# Patient Record
Sex: Female | Born: 1995 | Race: White | Hispanic: No | Marital: Single | Smoking: Current every day smoker
Health system: Southern US, Community
[De-identification: ages and names within clinical notes are randomized; demographics above are authoritative.]

## PROBLEM LIST (undated history)

## (undated) DIAGNOSIS — F319 Bipolar disorder, unspecified: Secondary | ICD-10-CM

## (undated) DIAGNOSIS — G90A Postural orthostatic tachycardia syndrome (POTS): Secondary | ICD-10-CM

## (undated) DIAGNOSIS — M25511 Pain in right shoulder: Secondary | ICD-10-CM

## (undated) DIAGNOSIS — J45909 Unspecified asthma, uncomplicated: Secondary | ICD-10-CM

## (undated) DIAGNOSIS — A749 Chlamydial infection, unspecified: Principal | ICD-10-CM

## (undated) DIAGNOSIS — K219 Gastro-esophageal reflux disease without esophagitis: Secondary | ICD-10-CM

## (undated) DIAGNOSIS — E282 Polycystic ovarian syndrome: Secondary | ICD-10-CM

## (undated) DIAGNOSIS — M542 Cervicalgia: Secondary | ICD-10-CM

## (undated) DIAGNOSIS — G540 Brachial plexus disorders: Secondary | ICD-10-CM

## (undated) DIAGNOSIS — I498 Other specified cardiac arrhythmias: Secondary | ICD-10-CM

## (undated) DIAGNOSIS — K589 Irritable bowel syndrome without diarrhea: Secondary | ICD-10-CM

## (undated) DIAGNOSIS — F419 Anxiety disorder, unspecified: Secondary | ICD-10-CM

## (undated) DIAGNOSIS — M549 Dorsalgia, unspecified: Secondary | ICD-10-CM

## (undated) DIAGNOSIS — T7840XA Allergy, unspecified, initial encounter: Secondary | ICD-10-CM

## (undated) DIAGNOSIS — F32A Depression, unspecified: Secondary | ICD-10-CM

## (undated) DIAGNOSIS — I471 Supraventricular tachycardia, unspecified: Secondary | ICD-10-CM

## (undated) DIAGNOSIS — D649 Anemia, unspecified: Secondary | ICD-10-CM

## (undated) DIAGNOSIS — R002 Palpitations: Secondary | ICD-10-CM

## (undated) HISTORY — PX: BONE RESECTION, RIB: SHX900

## (undated) HISTORY — DX: Palpitations: R00.2

## (undated) HISTORY — DX: Dorsalgia, unspecified: M54.9

## (undated) HISTORY — DX: Anemia, unspecified: D64.9

## (undated) HISTORY — DX: Irritable bowel syndrome, unspecified: K58.9

## (undated) HISTORY — DX: Anxiety disorder, unspecified: F41.9

## (undated) HISTORY — PX: UPPER GASTROINTESTINAL ENDOSCOPY: SHX188

## (undated) HISTORY — DX: Pain in right shoulder: M25.511

## (undated) HISTORY — DX: Bipolar disorder, unspecified: F31.9

## (undated) HISTORY — DX: Depression, unspecified: F32.A

## (undated) HISTORY — DX: Brachial plexus disorders: G54.0

## (undated) HISTORY — PX: NO PAST SURGERIES: SHX2092

## (undated) HISTORY — PX: COLONOSCOPY: SHX174

## (undated) HISTORY — DX: Gastro-esophageal reflux disease without esophagitis: K21.9

## (undated) HISTORY — DX: Unspecified asthma, uncomplicated: J45.909

## (undated) HISTORY — DX: Cervicalgia: M54.2

## (undated) HISTORY — DX: Allergy, unspecified, initial encounter: T78.40XA

## (undated) HISTORY — DX: Other specified cardiac arrhythmias: I49.8

## (undated) HISTORY — DX: Polycystic ovarian syndrome: E28.2

## (undated) HISTORY — DX: Chlamydial infection, unspecified: A74.9

## (undated) HISTORY — PX: SHOULDER SURGERY: SHX246

## (undated) HISTORY — DX: Postural orthostatic tachycardia syndrome (POTS): G90.A

---
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
## 2016-05-29 ENCOUNTER — Emergency Department (HOSPITAL_COMMUNITY)
Admission: EM | Admit: 2016-05-29 | Discharge: 2016-05-29 | Disposition: A | Discharge status: Home / Self Care | Payer: Worker's Compensation | Attending: Emergency Medicine | Admitting: Emergency Medicine

## 2016-05-29 ENCOUNTER — Encounter (HOSPITAL_COMMUNITY): Payer: Self-pay | Admitting: *Deleted

## 2016-05-29 ENCOUNTER — Emergency Department (HOSPITAL_COMMUNITY): Payer: Worker's Compensation

## 2016-05-29 DIAGNOSIS — X58XXXA Exposure to other specified factors, initial encounter: Secondary | ICD-10-CM | POA: Diagnosis not present

## 2016-05-29 DIAGNOSIS — S4992XA Unspecified injury of left shoulder and upper arm, initial encounter: Secondary | ICD-10-CM | POA: Diagnosis present

## 2016-05-29 DIAGNOSIS — Y999 Unspecified external cause status: Secondary | ICD-10-CM | POA: Insufficient documentation

## 2016-05-29 DIAGNOSIS — Y929 Unspecified place or not applicable: Secondary | ICD-10-CM | POA: Diagnosis not present

## 2016-05-29 DIAGNOSIS — S40021A Contusion of right upper arm, initial encounter: Secondary | ICD-10-CM | POA: Diagnosis not present

## 2016-05-29 DIAGNOSIS — F17210 Nicotine dependence, cigarettes, uncomplicated: Secondary | ICD-10-CM | POA: Insufficient documentation

## 2016-05-29 DIAGNOSIS — S46912A Strain of unspecified muscle, fascia and tendon at shoulder and upper arm level, left arm, initial encounter: Secondary | ICD-10-CM | POA: Diagnosis not present

## 2016-05-29 DIAGNOSIS — Y9389 Activity, other specified: Secondary | ICD-10-CM | POA: Insufficient documentation

## 2016-05-29 MED ORDER — HYDROCODONE-ACETAMINOPHEN 5-325 MG PO TABS: 1.0000 | ORAL_TABLET | ORAL | 0 refills | Status: DC | PRN

## 2016-05-29 MED ORDER — METHOCARBAMOL 500 MG PO TABS: 500.0000 mg | ORAL_TABLET | Freq: Three times a day (TID) | ORAL | 0 refills | Status: DC

## 2016-05-29 MED ORDER — HYDROCODONE-ACETAMINOPHEN 5-325 MG PO TABS
2.0000 | ORAL_TABLET | Freq: Once | ORAL | Status: AC
Start: 2016-05-29 — End: 2016-05-29
  Administered 2016-05-29: 2 via ORAL
  Filled 2016-05-29: qty 2

## 2016-05-29 MED ORDER — DICLOFENAC SODIUM 75 MG PO TBEC: 75.0000 mg | DELAYED_RELEASE_TABLET | Freq: Two times a day (BID) | ORAL | 0 refills | Status: DC

## 2016-05-29 MED ORDER — METHOCARBAMOL 500 MG PO TABS
1000.0000 mg | ORAL_TABLET | Freq: Once | ORAL | Status: AC
  Administered 2016-05-29: 1000 mg via ORAL
  Filled 2016-05-29: qty 2

## 2016-05-29 NOTE — ED Notes (Signed)
Pt alert & oriented x4, stable gait. Patient given discharge instructions, paperwork & prescription(s). Patient informed not to drive, operate any equipment & handel any important documents 4 hours after taking pain medication. Patient  instructed to stop at the registration desk to finish any additional paperwork. Patient  verbalized understanding. Pt left department w/ no further questions. 
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
## 2016-05-29 NOTE — Discharge Instructions (Signed)
The x-rays of your shoulder arm and elbow are negative for fracture or dislocation. Your examination favors a strain/sprain of your shoulder, and a bruise or contusion to your upper arm. Please use the shoulder sling until you are able to safely use the right upper extremity. Use diclofenac and Robaxin daily. Use Norco for more severe pain. Robaxin and Norco may cause drowsiness, please use these medications with caution. Please see Dr. Romeo AppleHarrison for orthopedic evaluation if your symptoms are worsening.

## 2016-06-02 ENCOUNTER — Telehealth: Payer: Self-pay | Admitting: Orthopaedic Surgery
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
## 2016-06-04 NOTE — Telephone Encounter (Signed)
Call received, per patient's employer contact with Jeanie CooksAngie S, providing the following contact information:  [employer], Attention Ellin MayhewMike Gray, [address], [place], [place] [address], ph# [phone], although No Fax #, No Claim # as of yet.  Called the phone # received, and placed on hold almost 12 minutes, and no option to speak with a person.  Called back to patient, reached her voice mail, relayed we are still awaiting information so that we may fax our workers comp set-up form, and request CLAIM#.

## 2016-06-08 ENCOUNTER — Ambulatory Visit (INDEPENDENT_AMBULATORY_CARE_PROVIDER_SITE_OTHER): Payer: Worker's Compensation | Admitting: Orthopedic Surgery

## 2016-06-08 ENCOUNTER — Encounter: Payer: Self-pay | Admitting: Orthopedic Surgery

## 2016-06-08 DIAGNOSIS — S143XXA Injury of brachial plexus, initial encounter: Secondary | ICD-10-CM

## 2016-06-08 MED ORDER — HYDROCODONE-ACETAMINOPHEN 5-325 MG PO TABS: 1.0000 | ORAL_TABLET | Freq: Four times a day (QID) | ORAL | 0 refills | Status: DC | PRN

## 2016-06-08 MED ORDER — DICLOFENAC SODIUM 75 MG PO TBEC: 75.0000 mg | DELAYED_RELEASE_TABLET | Freq: Two times a day (BID) | ORAL | 2 refills | Status: DC

## 2016-06-08 MED ORDER — METHOCARBAMOL 500 MG PO TABS: 500.0000 mg | ORAL_TABLET | Freq: Three times a day (TID) | ORAL | 2 refills | Status: DC

## 2016-06-08 NOTE — Patient Instructions (Signed)
OOW 10-14 TO 4-14   CONTINUE THE MEDICATIONS AS PRESCRIBED   WE WILL CONTACT THE INSURER TO GET APPTS  WITH NEUROLOGY  PHYSICAL AND OCCUPATIONAL THERAPY     YOU WILL NEED A NERVE STUDY FIRST OF DEC
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
## 2016-06-10 NOTE — Addendum Note (Signed)
Addended by: Adella HareBOOTHE, JAIME B on: 06/10/2016 03:31 PM   Modules accepted: Orders

## 2016-07-05 ENCOUNTER — Other Ambulatory Visit: Payer: Self-pay | Admitting: *Deleted

## 2016-07-05 ENCOUNTER — Telehealth: Payer: Self-pay | Admitting: Orthopedic Surgery

## 2016-07-05 DIAGNOSIS — S143XXA Injury of brachial plexus, initial encounter: Secondary | ICD-10-CM

## 2016-07-05 NOTE — Telephone Encounter (Signed)
DONE
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
## 2016-07-15 NOTE — Telephone Encounter (Signed)
Talked with pt and her WC is working on getting her a appt with neurology.

## 2016-07-22 ENCOUNTER — Encounter: Payer: Self-pay | Admitting: Advanced Practice Midwife

## 2016-07-22 ENCOUNTER — Ambulatory Visit (INDEPENDENT_AMBULATORY_CARE_PROVIDER_SITE_OTHER): Payer: BLUE CROSS/BLUE SHIELD | Admitting: Advanced Practice Midwife

## 2016-07-22 VITALS — BP 110/80 | HR 96 | Wt 158.0 lb

## 2016-07-22 DIAGNOSIS — N760 Acute vaginitis: Secondary | ICD-10-CM | POA: Diagnosis not present

## 2016-07-22 DIAGNOSIS — B9689 Other specified bacterial agents as the cause of diseases classified elsewhere: Secondary | ICD-10-CM | POA: Diagnosis not present

## 2016-07-22 DIAGNOSIS — Z30014 Encounter for initial prescription of intrauterine contraceptive device: Secondary | ICD-10-CM

## 2016-07-22 DIAGNOSIS — N898 Other specified noninflammatory disorders of vagina: Secondary | ICD-10-CM | POA: Diagnosis not present

## 2016-07-22 DIAGNOSIS — Z3009 Encounter for other general counseling and advice on contraception: Secondary | ICD-10-CM

## 2016-07-22 MED ORDER — METRONIDAZOLE 500 MG PO TABS
500.0000 mg | ORAL_TABLET | Freq: Two times a day (BID) | ORAL | 0 refills | Status: DC
Start: 2016-07-22 — End: 2016-12-31

## 2016-07-22 MED ORDER — MISOPROSTOL 200 MCG PO TABS: ORAL_TABLET | ORAL | 0 refills | Status: DC

## 2016-07-22 NOTE — Progress Notes (Signed)
.    [facility] Visit  Patient name: Bethany Navarro MRN [id number]  Date of birth: 1996-01-04  CC & HPI:  Bethany Navarro is a 20 y.o. Caucasian female presenting today for C/O fishy odor off and on for a year.  Took BV meds once, otherwise felt like it has :come and gone on it's own"  Sx have been present for the past week, and "isn't going away this time".    Wants IUD.  Uses condoms "100%". [address]  [employer] [employer], leaves Fall of 2018, wants [facility].  Declines STD testing.   Pertinent History Reviewed:  Medical & Surgical Hx:   No past medical history on file. Past Surgical History:  Procedure Laterality Date  . NO PAST SURGERIES     No family history on file.  Current Outpatient Prescriptions:  .  diclofenac (VOLTAREN) 75 MG EC tablet, Take 1 tablet (75 mg total) by mouth 2 (two) times daily., Disp: 90 tablet, Rfl: 2 .  HYDROcodone-acetaminophen (NORCO/VICODIN) 5-325 MG tablet, Take 1 tablet by mouth every 6 (six) hours as needed., Disp: 90 tablet, Rfl: 0 .  methocarbamol (ROBAXIN) 500 MG tablet, Take 1 tablet (500 mg total) by mouth 3 (three) times daily., Disp: 90 tablet, Rfl: 2 .  metroNIDAZOLE (FLAGYL) 500 MG tablet, Take 1 tablet (500 mg total) by mouth 2 (two) times daily., Disp: 14 tablet, Rfl: 0 .  misoprostol (CYTOTEC) 200 MCG tablet, Take 6 hours before your appointment, Disp: 2 tablet, Rfl: 0 Social History: Reviewed -  reports that she has been smoking Cigarettes.  She has been smoking about 0.50 packs per day. She has never used smokeless tobacco.  Review of Systems:   Constitutional: Negative for fever and chills Eyes: Negative for visual disturbances Respiratory: Negative for shortness of breath, dyspnea Cardiovascular: Negative for chest pain or palpitations  Gastrointestinal: Negative for vomiting, diarrhea and constipation; no abdominal pain Genitourinary: Negative for dysuria and urgency, vaginal irritation or itching Musculoskeletal: Negative  for back pain, joint pain, myalgias  Neurological: Negative for dizziness and headaches    Objective Findings:    Physical Examination: General appearance - well appearing, and in no distress Mental status - alert, oriented to person, place, and time Chest:  Normal respiratory effort Heart - normal rate and regular rhythm Abdomen:  Soft, nontender Pelvic: SSE:  White frothy DC w/only sl amine odor.  Wet prep:  Few clue, numerous WBC, no trich or yeast.  Musculoskeletal:  Normal range of motion without pain Extremities:  No edema    No results found for this or any previous visit (from the past 24 hour(s)).    Assessment & Plan:  A:   Few Clue  Leukorrhea P:  . Orders Placed This Encounter  Procedures  . Trichomonas vaginalis, RNA  . GC/Chlamydia Probe Amp  D/T WBC  Return for IUD insertion 2nd week of January (check insutrance).  [facility] and premedicate w/ cytotec. Cycles ~ q 45 days, will put in on next period  Rx Flagyl  Probiotic q week CRESENZO-DISHMAN,Shelena Castelluccio CNM 07/22/2016 3:36 PM

## 2016-07-22 NOTE — Patient Instructions (Signed)
luvena or similar once a week

## 2016-07-26 LAB — GC/CHLAMYDIA PROBE AMP
Chlamydia trachomatis, NAA: NEGATIVE
Neisseria gonorrhoeae by PCR: NEGATIVE

## 2016-07-26 LAB — TRICHOMONAS VAGINALIS, PROBE AMP: Trich vag by NAA: NEGATIVE

## 2016-08-31 ENCOUNTER — Ambulatory Visit (INDEPENDENT_AMBULATORY_CARE_PROVIDER_SITE_OTHER): Payer: BLUE CROSS/BLUE SHIELD | Admitting: Advanced Practice Midwife

## 2016-08-31 ENCOUNTER — Encounter: Payer: Self-pay | Admitting: Advanced Practice Midwife

## 2016-08-31 VITALS — BP 138/64 | HR 86 | Ht 64.0 in | Wt 162.0 lb

## 2016-08-31 DIAGNOSIS — Z30014 Encounter for initial prescription of intrauterine contraceptive device: Secondary | ICD-10-CM | POA: Diagnosis not present

## 2016-08-31 DIAGNOSIS — Z3202 Encounter for pregnancy test, result negative: Secondary | ICD-10-CM

## 2016-08-31 DIAGNOSIS — Z30430 Encounter for insertion of intrauterine contraceptive device: Secondary | ICD-10-CM | POA: Diagnosis not present

## 2016-08-31 LAB — POCT URINE PREGNANCY: Preg Test, Ur: NEGATIVE

## 2016-08-31 NOTE — Progress Notes (Signed)
Arnetha CourserYasmine Danielle Ward is a 21 y.o. year old  female   who presents for placement of a Kyleena IUD. Her LMP was 2 days ago and her pregnancy test today is negative.    The risks and benefits of the method and placement have been thouroughly reviewed with the patient and all questions were answered.  Specifically the patient is aware of failure rate of 08/998, expulsion of the IUD and of possible perforation.  The patient is aware of irregular bleeding due to the method and understands the incidence of irregular bleeding diminishes with time.  Time out was performed.  A Graves speculum was placed.  The cervix was prepped using Betadine. The uterus was found to be neutral and it sounded to 7 cm.  The cervix was grasped with a tenaculum and the IUD was inserted to 7 cm.  It was pulled back 1 cm and the IUD was disengaged.  The strings were trimmed to 3 cm.  Sonogram was performed and the proper placement of the IUD was verified.  The patient was instructed on signs and symptoms of infection and to check for the strings after each menses or each month.  The patient is to refrain from intercourse for 3 days.  The patient is scheduled for a return appointment after her first menses or 4 weeks.  CRESENZO-DISHMAN,Ethelyn Cerniglia 08/31/2016 2:06 PM in

## 2016-09-28 ENCOUNTER — Ambulatory Visit (INDEPENDENT_AMBULATORY_CARE_PROVIDER_SITE_OTHER): Payer: BLUE CROSS/BLUE SHIELD | Admitting: Advanced Practice Midwife

## 2016-09-28 ENCOUNTER — Encounter: Payer: Self-pay | Admitting: Advanced Practice Midwife

## 2016-09-28 VITALS — BP 114/68 | HR 76 | Ht 64.0 in | Wt 160.0 lb

## 2016-09-28 DIAGNOSIS — Z30431 Encounter for routine checking of intrauterine contraceptive device: Secondary | ICD-10-CM

## 2016-09-28 NOTE — Progress Notes (Signed)
[facility] Visit  Patient name: Bethany Navarro MRN [id number]  Date of birth: 1996-03-14  CC & HPI:  Bethany Navarro is a 21 y.o.  female presenting today for IUD check. She had a Kyleen placed 1/25.  No bleeding at all  No problems.  Hasn't tried to check for strings yet.  Encouraged to do so.   Pertinent History Reviewed:  Medical & Surgical Hx:   History reviewed. No pertinent past medical history. Past Surgical History:  Procedure Laterality Date  . NO PAST SURGERIES     Family History  Problem Relation Age of Onset  . Cancer Paternal Grandfather     prostate  . Hypertension Paternal Grandfather   . Hypertension Paternal Grandmother   . Hypertension Father   . Ovarian cancer Other     Current Outpatient Prescriptions:  .  Pseudoeph-Doxylamine-DM-APAP (DAYQUIL/NYQUIL COLD/FLU RELIEF PO), Take by mouth., Disp: , Rfl:  .  diclofenac (VOLTAREN) 75 MG EC tablet, Take 1 tablet (75 mg total) by mouth 2 (two) times daily. (Patient not taking: Reported on 09/28/2016), Disp: 90 tablet, Rfl: 2 .  HYDROcodone-acetaminophen (NORCO/VICODIN) 5-325 MG tablet, Take 1 tablet by mouth every 6 (six) hours as needed. (Patient not taking: Reported on 08/31/2016), Disp: 90 tablet, Rfl: 0 .  methocarbamol (ROBAXIN) 500 MG tablet, Take 1 tablet (500 mg total) by mouth 3 (three) times daily. (Patient not taking: Reported on 08/31/2016), Disp: 90 tablet, Rfl: 2 .  metroNIDAZOLE (FLAGYL) 500 MG tablet, Take 1 tablet (500 mg total) by mouth 2 (two) times daily. (Patient not taking: Reported on 08/31/2016), Disp: 14 tablet, Rfl: 0 .  misoprostol (CYTOTEC) 200 MCG tablet, Take 6 hours before your appointment (Patient not taking: Reported on 09/28/2016), Disp: 2 tablet, Rfl: 0 Social History: Reviewed -  reports that she has been smoking Cigarettes.  She has been smoking about 0.25 packs per day. She has never used smokeless tobacco.  Review of Systems:   Constitutional: Negative for fever  and chills Eyes: Negative for visual disturbances Respiratory: Negative for shortness of breath, dyspnea Cardiovascular: Negative for chest pain or palpitations  Gastrointestinal: Negative for vomiting, diarrhea and constipation; no abdominal pain Genitourinary: Negative for dysuria and urgency, vaginal irritation or itching Musculoskeletal: Negative for back pain, joint pain, myalgias  Neurological: Negative for dizziness and headaches    Objective Findings:    Physical Examination: General appearance - well appearing, and in no distress Mental status - alert, oriented to person, place, and time Chest:  Normal respiratory effort Heart - normal rate and regular rhythm Abdomen:  Soft, nontender Pelvic: Bedside US reveals fundal IUD,  Strings palpabke Musculoskeletal:  Normal range of motion without pain Extremities:  No edema    No results found for this or any previous visit (from the past 24 hour(s)).    Assessment & Plan:  A:   IUD check P:     Return if symptoms worsen or fail to improve.  CRESENZO-DISHMAN,Keeshia Sanderlin CNM 09/28/2016 10:04 AM

## 2016-12-08 ENCOUNTER — Telehealth: Payer: Self-pay | Admitting: Obstetrics & Gynecology

## 2016-12-29 ENCOUNTER — Ambulatory Visit: Payer: BLUE CROSS/BLUE SHIELD | Admitting: Adult Health

## 2016-12-31 ENCOUNTER — Encounter: Payer: Self-pay | Admitting: Adult Health

## 2016-12-31 ENCOUNTER — Ambulatory Visit (INDEPENDENT_AMBULATORY_CARE_PROVIDER_SITE_OTHER): Payer: BLUE CROSS/BLUE SHIELD | Admitting: Adult Health

## 2016-12-31 VITALS — BP 132/70 | HR 70 | Ht 62.0 in | Wt 163.0 lb

## 2016-12-31 DIAGNOSIS — N939 Abnormal uterine and vaginal bleeding, unspecified: Secondary | ICD-10-CM | POA: Diagnosis not present

## 2016-12-31 DIAGNOSIS — Z975 Presence of (intrauterine) contraceptive device: Secondary | ICD-10-CM

## 2016-12-31 DIAGNOSIS — Z3202 Encounter for pregnancy test, result negative: Secondary | ICD-10-CM

## 2016-12-31 DIAGNOSIS — R1032 Left lower quadrant pain: Secondary | ICD-10-CM

## 2016-12-31 LAB — POCT URINE PREGNANCY: Preg Test, Ur: NEGATIVE

## 2016-12-31 MED ORDER — DOXYCYCLINE HYCLATE 100 MG PO CAPS: 100.0000 mg | ORAL_CAPSULE | Freq: Two times a day (BID) | ORAL | 0 refills | Status: DC

## 2016-12-31 NOTE — Progress Notes (Signed)
Subjective:     Patient ID: Bethany Navarro, female   DOB: 08-08-1996, 21 y.o.   MRN: [id number]  HPI Bethany GranaYasmine is a 21 year old white female, in complaining of bleeding for 23 days, has Kyleena IUD and pain in left side for 1 week.No new sex partners, no problem peeing or with BMs.  Review of Systems Pain in left side for 1 week  Bleeding for 23 days No problems peeing or with BMs.  Reviewed past medical,surgical, social and family history. Reviewed medications and allergies.     Objective:   Physical Exam BP 132/70 (BP Location: Left Arm, Patient Position: Sitting, Cuff Size: Normal)   Pulse 70   Ht 5\' 2"  (1.575 m)   Wt 163 lb (73.9 kg)   LMP 12/08/2016 Comment: on going bleeding  BMI 29.81 kg/m UPT negative, Skin warm and dry.Pelvic: external genitalia is normal in appearance no lesions, vagina: tan  discharge without odor,urethra has no lesions or masses noted, cervix:smooth, +IUD strings at os, no CMT, uterus: normal size, shape and contour, mildly tender, no masses felt, adnexa: no masses, LLQ tenderness noted. Bladder is non tender and no masses felt.  GC/CHL obtained.    Will rx doxycycline and check CBC and get US in 3 days.  Assessment:     1. LLQ pain   2. IUD (intrauterine device) in place   3. Abnormal uterine bleeding (AUB)   4. Pregnancy examination or test, negative result       Plan:     Rx doxycycline 100 mg take 1 bid x 10 days Check CBC  GC/CHL sent  Return in 3 days for GYN US and see me

## 2017-01-01 LAB — CBC
Hematocrit: 41.8 % (ref 34.0–46.6)
Hemoglobin: 14 g/dL (ref 11.1–15.9)
MCH: 27.6 pg (ref 26.6–33.0)
MCHC: 33.5 g/dL (ref 31.5–35.7)
MCV: 82 fL (ref 79–97)
Platelets: 346 10*3/uL (ref 150–379)
RBC: 5.08 x10E6/uL (ref 3.77–5.28)
RDW: 14.1 % (ref 12.3–15.4)
WBC: 11.5 10*3/uL — ABNORMAL HIGH (ref 3.4–10.8)

## 2017-01-03 ENCOUNTER — Ambulatory Visit (INDEPENDENT_AMBULATORY_CARE_PROVIDER_SITE_OTHER): Payer: BLUE CROSS/BLUE SHIELD

## 2017-01-03 ENCOUNTER — Ambulatory Visit (INDEPENDENT_AMBULATORY_CARE_PROVIDER_SITE_OTHER): Payer: BLUE CROSS/BLUE SHIELD | Admitting: Adult Health

## 2017-01-03 ENCOUNTER — Encounter: Payer: Self-pay | Admitting: Adult Health

## 2017-01-03 VITALS — BP 118/60 | HR 72 | Ht 63.0 in | Wt 160.5 lb

## 2017-01-03 DIAGNOSIS — R1032 Left lower quadrant pain: Secondary | ICD-10-CM | POA: Diagnosis not present

## 2017-01-03 DIAGNOSIS — N939 Abnormal uterine and vaginal bleeding, unspecified: Secondary | ICD-10-CM

## 2017-01-03 DIAGNOSIS — A749 Chlamydial infection, unspecified: Secondary | ICD-10-CM

## 2017-01-03 DIAGNOSIS — Z975 Presence of (intrauterine) contraceptive device: Secondary | ICD-10-CM

## 2017-01-03 HISTORY — DX: Chlamydial infection, unspecified: A74.9

## 2017-01-03 LAB — GC/CHLAMYDIA PROBE AMP
Chlamydia trachomatis, NAA: POSITIVE — AB
Neisseria gonorrhoeae by PCR: NEGATIVE

## 2017-01-03 NOTE — Progress Notes (Signed)
Pelvic us TA/TV: homogeneous anteverted uterus,wnl,normal ovaries bilat,IUD is located within the LUS,EEC 6.3 mm,no free fluid,ovaries appear mobile,left adnexal discomfort during ultrasound

## 2017-01-03 NOTE — Progress Notes (Signed)
Subjective:     Patient ID: Bethany Navarro, female   DOB: 23-Jul-1996, 21 y.o.   MRN: [id number]  HPI Bethany Navarro is a 21 year old white female in for US for LLQ pain and review labs.Feels a little better.   Review of Systems Feels a little better Reviewed past medical,surgical, social and family history. Reviewed medications and allergies.     Objective:   Physical Exam BP 118/60 (BP Location: Left Arm, Patient Position: Sitting, Cuff Size: Normal)   Pulse 72   Ht 5\' 3"  (1.6 m)   Wt 160 lb 8 oz (72.8 kg)   LMP 12/08/2016 Comment: on going bleeding  BMI 28.43 kg/m Pt informed of + chlamdyia ant that doxycycline should treat.US showed normal ovaries and uterus, IUD in LUS, and EEC 6.3 mm.    Assessment:     1. Chlamydia infection   2. IUD (intrauterine device) in place       Plan:     Finish doxycycline No sex Tell ex partner he needs to be treated Follow up in 4 weeks for POC   [facility] sent

## 2017-01-03 NOTE — Patient Instructions (Signed)
Chlamydia, Female Chlamydia is an STD (sexually transmitted disease). It is a bacterial infection that spreads (is contagious) through sexual contact. Chlamydia can occur in different areas of the body, including:  The tube that moves urine from the bladder out of the body (urethra).  The lower part of the uterus (cervix).  The throat.  The rectum.  This condition is not difficult to treat. However, if left untreated, chlamydia can lead to more serious health problems, including pelvic inflammatory disorder (PID). PID can increase your risk of not being able to have children (sterility). What are the causes? Chlamydia is caused by the bacteria Chlamydia trachomatis. It is passed from an infected partner during sexual activity. Chlamydia can spread through contact with the genitals, mouth, or rectum. What are the signs or symptoms? In some cases, there may not be any symptoms for this condition (asymptomatic), especially early in the infection. If symptoms develop, they may include:  Burning with urination.  Frequent urination.  Vaginal discharge.  Redness, soreness, and swelling (inflammation) of the rectum.  Bleeding or discharge from the rectum.  Abdominal pain.  Pain during sexual intercourse.  Bleeding between menstrual periods.  Itching, burning, or redness in the eyes, or discharge from the eyes.  How is this diagnosed? This condition may be diagnosed with:  Urine tests.  Swab tests. Depending on your symptoms, your health care provider may use a cotton swab to collect discharge from your vagina or rectum to test for the bacteria.  A pelvic exam.  How is this treated? This condition is treated with antibiotic medicines. If you are pregnant, certain types of antibiotics will need to be avoided. Follow these instructions at home: Medicines  Take over-the-counter and prescription medicines only as told by your health care provider.  Take your antibiotic medicine  as told by your health care provider. Do not stop taking the antibiotic even if you start to feel better. Sexual activity  Tell sexual partners about your infection. This includes any oral, anal, or vaginal sex partners you have had within 60 days of when your symptoms started. Sexual partners should also be treated, even if they have no signs of the disease.  Do not have sex until you and your sexual partners have completed treatment and your health care provider says it is okay. If your health care provider prescribed you a single dose treatment, wait 7 days after taking the treatment before having sex. General instructions  It is your responsibility to get your test results. Ask your health care provider, or the department performing the test, when your results will be ready.  Get plenty of rest.  Eat a healthy, well-balanced diet.  Drink enough fluids to keep your urine clear or pale yellow.  Keep all follow-up visits as told by your health care provider. This is important. You may need to be tested for infection again 3 months after treatment. How is this prevented? The only sure way to prevent chlamydia is to avoid having sex. However, you can lower your risk by:  Using latex condoms correctly every time you have sex.  Not having multiple sexual partners.  Asking if your sexual partner has been tested for STIs and had negative results.  Contact a health care provider if:  You develop new symptoms or your symptoms do not get better after completing treatment.  You have a fever or chills.  You have pain during sexual intercourse. Get help right away if:  Your pain gets worse and does   not get better with medicine.  You develop flu-like symptoms, such as night sweats, sore throat, or muscle aches.  You experience nausea or vomiting.  You have difficulty swallowing.  You have bleeding between periods or after sex.  You have irregular menstrual periods.  You have  abdominal or lower back pain that does not get better with medicine.  You feel weak or dizzy, or you faint.  You are pregnant and you develop symptoms of chlamydia. Summary  Chlamydia is an STD (sexually transmitted disease). It is a bacterial infection that spreads (is contagious) through sexual contact.  This condition is not difficult to treat, however. If left untreated, chlamydia can lead to more serious health problems, including pelvic inflammatory disease (PID).  In some cases, there may not be any symptoms for this condition (asymptomatic).  This condition is treated with antibiotic medicines.  Using latex condoms correctly every time you have sex can help prevent chlamydia. This information is not intended to replace advice given to you by your health care provider. Make sure you discuss any questions you have with your health care provider. Document Released: 05/12/2005 Document Revised: 07/19/2016 Document Reviewed: 07/19/2016 Elsevier Interactive Patient Education  2017 [employer]  

## 2017-02-07 ENCOUNTER — Ambulatory Visit: Payer: BLUE CROSS/BLUE SHIELD | Admitting: Adult Health

## 2017-11-29 ENCOUNTER — Ambulatory Visit: Payer: Self-pay | Admitting: Physical Therapy

## 2018-01-25 ENCOUNTER — Encounter: Payer: Self-pay | Admitting: Physical Therapy

## 2018-01-25 ENCOUNTER — Ambulatory Visit: Payer: Worker's Compensation | Attending: Vascular Surgery | Admitting: Physical Therapy

## 2018-01-25 DIAGNOSIS — G540 Brachial plexus disorders: Secondary | ICD-10-CM | POA: Insufficient documentation

## 2018-01-25 DIAGNOSIS — M25611 Stiffness of right shoulder, not elsewhere classified: Secondary | ICD-10-CM

## 2018-01-25 NOTE — Addendum Note (Signed)
Addended by: Guss BundeMANGAWANG, Juaquin [place] on: 01/25/2018 03:32 PM   Modules accepted: Orders

## 2018-01-25 NOTE — Therapy (Signed)
[facility] [address] [place], [place], [address] Phone: [phone]   Fax:  [phone]  Physical Therapy Evaluation  Patient Details  Name: Bethany CourserYasmine Danielle Navarro MRN: [id number] Date of Birth: [birth year] Referring Provider: Valora PiccoloJulie Ann Freischlag   Encounter Date: 01/25/2018  PT End of Session - 01/25/18 1505    Visit Number  1    Number of Visits  24    Date for PT Re-Evaluation  03/29/18    Authorization - Visit Number  1    Authorization - Number of Visits  24    PT Start Time  1300    PT Stop Time  1343    PT Time Calculation (min)  43 min    Activity Tolerance  Patient tolerated treatment well    Behavior During Therapy  [facility] for tasks assessed/performed       Past Medical History:  Diagnosis Date  . Back pain   . Chlamydia infection 01/03/2017  . Neck pain   . Shoulder pain, right    frozen shoulder    Past Surgical History:  Procedure Laterality Date  . NO PAST SURGERIES      There were no vitals filed for this visit.   Subjective Assessment - 01/25/18 1502    Subjective  Patient arrives to physical therapy with right shoulder tightness and decreased range of motion secondary to right thoracic outlet syndrome surgery on November 02, 2017. Patient reported her first rib and muscle was removed. Patient reported minimal difficulties with ADLs. Patient denies any pain with exception of a "tightness" sensation in the axilla during shoulder abduction and flexion. Patient reports numbness from pectoralis, just medial to axilla to right elbow. Patient's goals are to improve movement, improve strength and return to work and prior level of function.    Limitations  Lifting;House hold activities    Diagnostic tests  MRI, CT, Myelogram    Patient Stated Goals  Get back to normal activities and work.    Currently in Pain?  No/denies         [facility] - 01/25/18 0001      Assessment   Medical Diagnosis  Right  thoracic outlet syndrome surgery    Referring Provider  Valora PiccoloJulie Ann Freischlag    Onset Date/Surgical Date  11/02/17    Hand Dominance  Right    Next MD Visit  August 8    Prior Therapy  Yes      Precautions   Precautions  Shoulder    Precaution Comments  See Media      Restrictions   Weight Bearing Restrictions  No      Balance Screen   Has the patient fallen in the past 6 months  No    Has the patient had a decrease in activity level because of a fear of falling?   No    Is the patient reluctant to leave their home because of a fear of falling?   No      Home Public house managernvironment   Living Environment  Private residence      Prior Function   Level of Independence  Independent      Sensation   Light Touch  Impaired by gross assessment decreased sensation along medial humerus      ROM / Strength   AROM / PROM / Strength  AROM;PROM;Strength      AROM   Overall AROM   Deficits    Overall AROM Comments  WFL shoulder flexion,  ER, and IR; WNL cervical ROM    AROM Assessment Site  Shoulder    Right/Left Shoulder  Right    Right Shoulder ABduction  108 Degrees      PROM   Overall PROM Comments  R shoulder WNL in all planes; Cervical PROM WNL    PROM Assessment Site  Shoulder    Right/Left Shoulder  Right      Strength   Overall Strength Comments  R grip strength: 18 lbs of force, Left grip strength: 60 lbs of force    Strength Assessment Site  Shoulder    Right/Left Shoulder  Right    Right Shoulder Flexion  4/5    Right Shoulder ABduction  4/5    Right Shoulder Internal Rotation  4+/5    Right Shoulder External Rotation  4+/5      Palpation   Palpation comment  tenderness to palpation along pectoralis muscles and superior shoulder.                Objective measurements completed on examination: See above findings.              PT Education - 01/25/18 1504    Education Details  scapular retractions, abduction table slide,  STW/M and scar massage     Person(s) Educated  Patient    Methods  Explanation;Demonstration;Handout    Comprehension  Verbalized understanding          PT Long Term Goals - 01/25/18 1509      PT LONG TERM GOAL #1   Title  Patient will be independent with HEP    Time  8    Period  Weeks    Status  New      PT LONG TERM GOAL #2   Title  Patient will improve right grip strength to 60 lb of force or greater for improved function.    Time  6    Period  Weeks    Status  New      PT LONG TERM GOAL #3   Title  Patient will improve right shoulder strength to 5/5 for stability during functional and work activities, when medically cleared to return to work.    Time  6    Period  Weeks    Status  New      PT LONG TERM GOAL #4   Title  Patient will improve right shoulder abduction AROM to WNL to improve ability to perform ADLs and functional tasks.    Time  6    Period  Weeks    Status  New             Plan - 01/25/18 1505    Clinical Impression Statement  Patient is a 22 year old female who presents to physical therapy with reports of shoulder tightness secondary to TOS surgery, November 02, 2017. Patient demonstrates decreased shoulder AROM, WFL shoulder PROM, WFL cervical AROM and PROM. Patient noted good/ good plus strength in the right shoulder. Patient limited with right grip strength in comparison to left. Patient noted with tenderness to palpation along right pectoralis major and minor as well as superior shoulder. Patient would benefit from skilled physical therapy to address deficits and address patient's goals.    History and Personal Factors relevant to plan of care:  TOS surgery November 02, 2017    Clinical Presentation  Stable    Clinical Decision Making  Low    Rehab Potential  Excellent    PT Frequency  3x / week    PT Duration  8 weeks    PT Treatment/Interventions  Electrical Stimulation;Cryotherapy;Moist Heat;Iontophoresis 4mg /ml Dexamethasone;Neuromuscular re-education;Therapeutic  exercise;Therapeutic activities;Passive range of motion;Manual techniques;Taping;Vasopneumatic Device;ADLs/Self Care Home Management;Ultrasound    PT Next Visit Plan  Scapular retractions, chin tucks, AAROM for shoulder abduction Scar massage, STW/M to pectoralis, and axilla, modalities for pain    PT Home Exercise Plan  scapular retactions, shoulder abd table slides    Consulted and Agree with Plan of Care  Patient       Patient will benefit from skilled therapeutic intervention in order to improve the following deficits and impairments:  Impaired UE functional use, Decreased strength, Decreased range of motion, Decreased activity tolerance, Decreased endurance  Visit Diagnosis: Stiffness of right shoulder, not elsewhere classified     Problem List Patient Active Problem List   Diagnosis Date Noted  . Chlamydia infection 01/03/2017  . Abnormal uterine bleeding (AUB) 12/31/2016  . IUD (intrauterine device) in place 12/31/2016  . LLQ pain 12/31/2016  . Encounter for IUD insertion 08/31/2016   Guss Bunde, PT, DPT 01/25/2018, 3:22 PM  [facility] [address] [place], [place], [address] Phone: [phone]   Fax:  [phone]  Name: Rewa Weissberg Navarro MRN: [id number] Date of Birth: 1996-03-14

## 2018-01-27 ENCOUNTER — Ambulatory Visit: Payer: Worker's Compensation | Admitting: Physical Therapy

## 2018-01-27 ENCOUNTER — Encounter: Payer: Self-pay | Admitting: Physical Therapy

## 2018-01-27 DIAGNOSIS — G540 Brachial plexus disorders: Secondary | ICD-10-CM | POA: Diagnosis not present

## 2018-01-27 DIAGNOSIS — M25611 Stiffness of right shoulder, not elsewhere classified: Secondary | ICD-10-CM

## 2018-01-27 NOTE — Therapy (Signed)
[facility] [address] [place], [place], [address] Phone: [phone]   Fax:  [phone]  Physical Therapy Treatment  Patient Details  Name: Bethany Navarro MRN: [id number] Date of Birth: 10-29-1995 Referring Provider: Valora Piccolo Freischlag   Encounter Date: 01/27/2018  PT End of Session - 01/27/18 1031    Visit Number  2    Number of Visits  24    Date for PT Re-Evaluation  03/29/18    Authorization - Visit Number  2    Authorization - Number of Visits  24    PT Start Time  1033    PT Stop Time  1111    PT Time Calculation (min)  38 min    Activity Tolerance  Patient tolerated treatment well    Behavior During Therapy  [facility] for tasks assessed/performed       Past Medical History:  Diagnosis Date  . Back pain   . Chlamydia infection 01/03/2017  . Neck pain   . Shoulder pain, right    frozen shoulder    Past Surgical History:  Procedure Laterality Date  . NO PAST SURGERIES      There were no vitals filed for this visit.  Subjective Assessment - 01/27/18 1031    Subjective  Reports that her release date is in August. Under along R medial humerus she is still numb.    Limitations  Lifting;House hold activities    Diagnostic tests  MRI, CT, Myelogram    Patient Stated Goals  Get back to normal activities and work.    Currently in Pain?  No/denies         [facility] PT Assessment - 01/27/18 0001      Assessment   Medical Diagnosis  Right thoracic outlet syndrome surgery    Onset Date/Surgical Date  11/02/17    Hand Dominance  Right    Next MD Visit  03/23/2018    Prior Therapy  Yes      Precautions   Precautions  Shoulder    Precaution Comments  See Media      Restrictions   Weight Bearing Restrictions  No                   [facility] Adult PT Treatment/Exercise - 01/27/18 0001      Exercises   Exercises  Shoulder      Shoulder Exercises: Standing   Protraction  Strengthening;Right;20  reps;Theraband    Theraband Level (Shoulder Protraction)  Level 1 (Yellow)    External Rotation  Strengthening;Right;20 reps;Theraband    Theraband Level (Shoulder External Rotation)  Level 1 (Yellow)    Internal Rotation  Strengthening;Right;20 reps;Theraband    Theraband Level (Shoulder Internal Rotation)  Level 1 (Yellow)    Flexion  AROM;Both;15 reps BUE slides for stretch    ABduction  AAROM;Right;20 reps    Extension  Strengthening;Right;20 reps;Theraband    Theraband Level (Shoulder Extension)  Level 1 (Yellow)    Row  Strengthening;Right;20 reps;Theraband    Theraband Level (Shoulder Row)  Level 1 (Yellow)      Shoulder Exercises: ROM/Strengthening   Wall Pushups  20 reps    Pushups  15 reps pushup plus      Manual Therapy   Manual Therapy  Soft tissue mobilization    Soft tissue mobilization  STW to R bicep, axilla, lateral pectoralis to reduce muscle tightness                  PT  Long Term Goals - 01/25/18 1509      PT LONG TERM GOAL #1   Title  Patient will be independent with HEP    Time  8    Period  Weeks    Status  New      PT LONG TERM GOAL #2   Title  Patient will improve right grip strength to 60 lb of force or greater for improved function.    Time  6    Period  Weeks    Status  New      PT LONG TERM GOAL #3   Title  Patient will improve right shoulder strength to 5/5 for stability during functional and work activities, when medically cleared to return to work.    Time  6    Period  Weeks    Status  New      PT LONG TERM GOAL #4   Title  Patient will improve right shoulder abduction AROM to WNL to improve ability to perform ADLs and functional tasks.    Time  6    Period  Weeks    Status  New            Plan - 01/27/18 1126    Clinical Impression Statement  Patient tolerated today's treatment well with no real complaints of pain just that stretches "hurt but felt good." Patient guided through multiple stretches and well as  shoulder stability and strength were tolerated well. Only minimal R scapular winging noted with normal wall pushups. Patient unable to tolerate towel under R axilla for resisted ER due to discomfort and sensation. Minimal R muscle tightness noted along R pectoralis or axilla. Patient encouraged to continue HEP and progression will occur next week.    Rehab Potential  Excellent    PT Frequency  3x / week    PT Duration  8 weeks    PT Treatment/Interventions  Electrical Stimulation;Cryotherapy;Moist Heat;Iontophoresis 4mg /ml Dexamethasone;Neuromuscular re-education;Therapeutic exercise;Therapeutic activities;Passive range of motion;Manual techniques;Taping;Vasopneumatic Device;ADLs/Self Care Home Management;Ultrasound    PT Next Visit Plan  Progress with shoulder strengthening with planks on elbows as well as more postural strengthening per protocol in media.    PT Home Exercise Plan  scapular retactions, shoulder abd table slides    Consulted and Agree with Plan of Care  Patient       Patient will benefit from skilled therapeutic intervention in order to improve the following deficits and impairments:  Impaired UE functional use, Decreased strength, Decreased range of motion, Decreased activity tolerance, Decreased endurance  Visit Diagnosis: Stiffness of right shoulder, not elsewhere classified     Problem List Patient Active Problem List   Diagnosis Date Noted  . Chlamydia infection 01/03/2017  . Abnormal uterine bleeding (AUB) 12/31/2016  . IUD (intrauterine device) in place 12/31/2016  . LLQ pain 12/31/2016  . Encounter for IUD insertion 08/31/2016    Marvell FullerKelsey P Kennon, PTA 01/27/2018, 11:36 AM  [facility] [address] [place], [place], [address] Phone: [phone]   Fax:  [phone]  Name: Bethany Navarro MRN: [id number] Date of Birth: Jun 27, 1996

## 2018-01-30 ENCOUNTER — Encounter: Payer: Self-pay | Admitting: Physical Therapy

## 2018-01-30 ENCOUNTER — Ambulatory Visit: Payer: Worker's Compensation | Admitting: Physical Therapy

## 2018-01-30 DIAGNOSIS — G540 Brachial plexus disorders: Secondary | ICD-10-CM | POA: Diagnosis not present

## 2018-01-30 DIAGNOSIS — M25611 Stiffness of right shoulder, not elsewhere classified: Secondary | ICD-10-CM

## 2018-01-30 NOTE — Therapy (Signed)
[facility] [address] [place], [place], [address] Phone: [phone]   Fax:  [phone]  Physical Therapy Treatment  Patient Details  Name: Bethany Navarro MRN: [id number] Date of Birth: 1996/01/25 Referring Provider: Valora PiccoloJulie Ann Freischlag   Encounter Date: 01/30/2018  PT End of Session - 01/30/18 1123    Visit Number  3    Number of Visits  24    Date for PT Re-Evaluation  03/29/18    Authorization - Visit Number  3    Authorization - Number of Visits  24    PT Start Time  1115    PT Stop Time  1200    PT Time Calculation (min)  45 min    Activity Tolerance  Patient tolerated treatment well    Behavior During Therapy  [facility] for tasks assessed/performed       Past Medical History:  Diagnosis Date  . Back pain   . Chlamydia infection 01/03/2017  . Neck pain   . Shoulder pain, right    frozen shoulder    Past Surgical History:  Procedure Laterality Date  . NO PAST SURGERIES      There were no vitals filed for this visit.  Subjective Assessment - 01/30/18 1119    Subjective  Patient reports no pain, just tightness with ROM. Last session went well and only reports muscle fatigue and "workout" soreness.    Limitations  Lifting;House hold activities    Diagnostic tests  MRI, CT, Myelogram    Patient Stated Goals  Get back to normal activities and work.    Currently in Pain?  No/denies         [facility] - 01/30/18 0001      Assessment   Medical Diagnosis  Right thoracic outlet syndrome surgery    Onset Date/Surgical Date  11/02/17    Hand Dominance  Right    Next MD Visit  03/23/2018    Prior Therapy  Yes      Precautions   Precautions  Shoulder    Precaution Comments  See Media                   [facility]/Exercise - 01/30/18 0001      Exercises   Exercises  Shoulder      Shoulder Exercises: Standing   Protraction  Strengthening;Right;20 reps;Theraband    Theraband  Level (Shoulder Protraction)  Level 1 (Yellow)    External Rotation  Strengthening;Right;20 reps;Theraband    Theraband Level (Shoulder External Rotation)  Level 1 (Yellow)    Internal Rotation  Strengthening;Right;20 reps;Theraband    Theraband Level (Shoulder Internal Rotation)  Level 1 (Yellow)    Extension  Strengthening;Right;20 reps;Theraband    Theraband Level (Shoulder Extension)  Level 1 (Yellow)    Row  Strengthening;Right;20 reps;Theraband    Theraband Level (Shoulder Row)  Level 1 (Yellow)      Shoulder Exercises: ROM/Strengthening   UBE (Upper Arm Bike)  120 RPM 8 minutes (4 fwd, 4 bkwd)    Wall Pushups  20 reps push up plus    Plank  3 reps;Other (comment) until fatigue up to 58 seconds      Manual Therapy   Manual Therapy  Soft tissue mobilization;Passive ROM    Soft tissue mobilization  STW to R bicep, axilla, lateral pectoralis to reduce muscle tightness    Passive ROM  PROM into abduction with STW/M to decrease tightness  PT Long Term Goals - 01/25/18 1509      PT LONG TERM GOAL #1   Title  Patient will be independent with HEP    Time  8    Period  Weeks    Status  New      PT LONG TERM GOAL #2   Title  Patient will improve right grip strength to 60 lb of force or greater for improved function.    Time  6    Period  Weeks    Status  New      PT LONG TERM GOAL #3   Title  Patient will improve right shoulder strength to 5/5 for stability during functional and work activities, when medically cleared to return to work.    Time  6    Period  Weeks    Status  New      PT LONG TERM GOAL #4   Title  Patient will improve right shoulder abduction AROM to WNL to improve ability to perform ADLs and functional tasks.    Time  6    Period  Weeks    Status  New            Plan - 01/30/18 1129    Clinical Impression Statement  Patient was able to tolerate treatment well with minimal reports of discomfort. Patient noted with the most  discomfort with theraband protraction. Patient reported minimal tightness with PROM into abduction with STW/M. Patient continues to have decreased AROM abduction as well as associated tightness from elbow to pectoralis. Patient instructed to perfrom AAROM abduction with PVC at home to improve motion. Patient reported understanding and reported agreement.     Clinical Presentation  Stable    Clinical Decision Making  Low    Rehab Potential  Excellent    PT Frequency  3x / week    PT Duration  8 weeks    PT Treatment/Interventions  Electrical Stimulation;Cryotherapy;Moist Heat;Iontophoresis 4mg /ml Dexamethasone;Neuromuscular re-education;Therapeutic exercise;Therapeutic activities;Passive range of motion;Manual techniques;Taping;Vasopneumatic Device;ADLs/Self Care Home Management;Ultrasound    PT Next Visit Plan  Progress with shoulder strengthening with planks on elbows as well as more postural strengthening per protocol in media.    PT Home Exercise Plan  scapular retactions, shoulder abd table slides    Consulted and Agree with Plan of Care  Patient       Patient will benefit from skilled therapeutic intervention in order to improve the following deficits and impairments:  Impaired UE functional use, Decreased strength, Decreased range of motion, Decreased activity tolerance, Decreased endurance  Visit Diagnosis: Stiffness of right shoulder, not elsewhere classified     Problem List Patient Active Problem List   Diagnosis Date Noted  . Chlamydia infection 01/03/2017  . Abnormal uterine bleeding (AUB) 12/31/2016  . IUD (intrauterine device) in place 12/31/2016  . LLQ pain 12/31/2016  . Encounter for IUD insertion 08/31/2016   Guss Bunde, PT, DPT 01/30/2018, 12:11 PM  [facility] [address] [place], [place], [address] Phone: [phone]   Fax:  [phone]  Name: Bethany Navarro MRN: [id number] Date of Birth:  02-Dec-1995

## 2018-01-30 NOTE — Addendum Note (Signed)
Addended byGuss Bunde: Shanayah Kaffenberger on: 01/30/2018 08:24 AM   Modules accepted: Orders

## 2018-02-01 ENCOUNTER — Ambulatory Visit: Payer: Worker's Compensation | Admitting: Physical Therapy

## 2018-02-01 ENCOUNTER — Encounter: Payer: Self-pay | Admitting: Physical Therapy

## 2018-02-01 DIAGNOSIS — M25611 Stiffness of right shoulder, not elsewhere classified: Secondary | ICD-10-CM

## 2018-02-01 DIAGNOSIS — G540 Brachial plexus disorders: Secondary | ICD-10-CM | POA: Diagnosis not present

## 2018-02-01 NOTE — Therapy (Signed)
[facility] [address] [place], [place], [address] Phone: [phone]   Fax:  [phone]  Physical Therapy Treatment  Patient Details  Name: Bethany Navarro MRN: [id number] Date of Birth: [birth year] Referring Provider: Valora PiccoloJulie Ann Freischlag   Encounter Date: 02/01/2018  PT End of Session - 02/01/18 1124    Visit Number  4    Number of Visits  24    Date for PT Re-Evaluation  03/29/18    Authorization - Visit Number  4    Authorization - Number of Visits  24    PT Start Time  1119    PT Stop Time  1206    PT Time Calculation (min)  47 min    Activity Tolerance  Patient tolerated treatment well    Behavior During Therapy  [facility] for tasks assessed/performed       Past Medical History:  Diagnosis Date  . Back pain   . Chlamydia infection 01/03/2017  . Neck pain   . Shoulder pain, right    frozen shoulder    Past Surgical History:  Procedure Laterality Date  . NO PAST SURGERIES      There were no vitals filed for this visit.  Subjective Assessment - 02/01/18 1124    Subjective  Patient reported feeling sore after last visit but soreness has disspiated.    Limitations  Lifting;House hold activities    Diagnostic tests  MRI, CT, Myelogram    Patient Stated Goals  Get back to normal activities and work.    Currently in Pain?  No/denies         [facility] - 02/01/18 0001      Assessment   Medical Diagnosis  Right thoracic outlet syndrome surgery    Onset Date/Surgical Date  11/02/17    Hand Dominance  Right    Next MD Visit  03/23/2018    Prior Therapy  Yes      Precautions   Precautions  Shoulder    Precaution Comments  See Media                   [facility] Adult PT Treatment/Exercise - 02/01/18 0001      Exercises   Exercises  Shoulder      Shoulder Exercises: Standing   Protraction  Strengthening;Right;10 reps;20 reps;Theraband x30    Theraband Level (Shoulder Protraction)  Level 1  (Yellow)    External Rotation  Strengthening;Right;10 reps;20 reps;Theraband    Theraband Level (Shoulder External Rotation)  Level 1 (Yellow)    Internal Rotation  Strengthening;Right;10 reps;20 reps;Theraband x30    Theraband Level (Shoulder Internal Rotation)  Level 2 (Red)    ABduction  AAROM;Right;20 reps with PVC    Extension  Strengthening;Right;10 reps;20 reps;Theraband x30    Theraband Level (Shoulder Extension)  Level 1 (Yellow)    Row  AROM;Right;Theraband x30    Theraband Level (Shoulder Row)  Level 2 (Red)      Shoulder Exercises: ROM/Strengthening   UBE (Upper Arm Bike)  90 RPM 8 (4 fwd, 4bkwd)    Side Plank  3 reps;Other (comment) until fatigue      Manual Therapy   Manual Therapy  Soft tissue mobilization;Passive ROM    Soft tissue mobilization  STW to R bicep, axilla, lateral pectoralis to reduce muscle tightness    Passive ROM  PROM into abduction with STW/M to decrease tightness  PT Long Term Goals - 01/25/18 1509      PT LONG TERM GOAL #1   Title  Patient will be independent with HEP    Time  8    Period  Weeks    Status  New      PT LONG TERM GOAL #2   Title  Patient will improve right grip strength to 60 lb of force or greater for improved function.    Time  6    Period  Weeks    Status  New      PT LONG TERM GOAL #3   Title  Patient will improve right shoulder strength to 5/5 for stability during functional and work activities, when medically cleared to return to work.    Time  6    Period  Weeks    Status  New      PT LONG TERM GOAL #4   Title  Patient will improve right shoulder abduction AROM to WNL to improve ability to perform ADLs and functional tasks.    Time  6    Period  Weeks    Status  New            Plan - 02/01/18 1210    Clinical Impression Statement  Patient was able to tolerate treatment well with minimal reports of shoulder joint pain. Patient noted with deep pectoralis adhesions but released  after STW/M. At end of session patient's AROM abduction was reassessed and patient was able to demonstrate full range with minimal reports of "pulling."    Clinical Presentation  Stable    Clinical Decision Making  Low    Rehab Potential  Excellent    PT Frequency  3x / week    PT Duration  8 weeks    PT Treatment/Interventions  Electrical Stimulation;Cryotherapy;Moist Heat;Iontophoresis 4mg /ml Dexamethasone;Neuromuscular re-education;Therapeutic exercise;Therapeutic activities;Passive range of motion;Manual techniques;Taping;Vasopneumatic Device;ADLs/Self Care Home Management;Ultrasound    PT Next Visit Plan  Progress with shoulder strengthening with planks on elbows as well as more postural strengthening per protocol in media.    Consulted and Agree with Plan of Care  Patient       Patient will benefit from skilled therapeutic intervention in order to improve the following deficits and impairments:  Impaired UE functional use, Decreased strength, Decreased range of motion, Decreased activity tolerance, Decreased endurance  Visit Diagnosis: Stiffness of right shoulder, not elsewhere classified     Problem List Patient Active Problem List   Diagnosis Date Noted  . Chlamydia infection 01/03/2017  . Abnormal uterine bleeding (AUB) 12/31/2016  . IUD (intrauterine device) in place 12/31/2016  . LLQ pain 12/31/2016  . Encounter for IUD insertion 08/31/2016   Guss Bunde, PT, DPT 02/01/2018, 1:11 PM  [facility] [address] [place], [place], [address] Phone: [phone]   Fax:  [phone]  Name: Bethany Navarro  MRN: [id number] Date of Birth: 1996-07-31

## 2018-02-03 ENCOUNTER — Ambulatory Visit: Payer: Worker's Compensation | Admitting: Physical Therapy

## 2018-02-03 ENCOUNTER — Encounter: Payer: Self-pay | Admitting: Physical Therapy

## 2018-02-03 DIAGNOSIS — M25611 Stiffness of right shoulder, not elsewhere classified: Secondary | ICD-10-CM

## 2018-02-03 DIAGNOSIS — G540 Brachial plexus disorders: Secondary | ICD-10-CM | POA: Diagnosis not present

## 2018-02-03 NOTE — Therapy (Signed)
[facility] [address] [place], [place], [address] Phone: [phone]   Fax:  [phone]  Physical Therapy Treatment  Patient Details  Name: Bethany Navarro MRN: [id number] Date of Birth: 1996-02-15 Referring Provider: Valora PiccoloJulie Ann Freischlag   Encounter Date: 02/03/2018  PT End of Session - 02/03/18 1155    Visit Number  5    Number of Visits  24    Date for PT Re-Evaluation  03/29/18    Authorization - Visit Number  5    Authorization - Number of Visits  24    PT Start Time  1118    PT Stop Time  1155    PT Time Calculation (min)  37 min    Activity Tolerance  Patient tolerated treatment well    Behavior During Therapy  [facility] for tasks assessed/performed       Past Medical History:  Diagnosis Date  . Back pain   . Chlamydia infection 01/03/2017  . Neck pain   . Shoulder pain, right    frozen shoulder    Past Surgical History:  Procedure Laterality Date  . NO PAST SURGERIES      There were no vitals filed for this visit.  Subjective Assessment - 02/03/18 1154    Subjective  Patient reported feeling a little sore following last treatment, but reported relief later in the day.     Limitations  Lifting;House hold activities    Diagnostic tests  MRI, CT, Myelogram    Patient Stated Goals  Get back to normal activities and work.    Currently in Pain?  No/denies                       [facility] Adult PT Treatment/Exercise - 02/03/18 0001      Exercises   Exercises  Shoulder      Shoulder Exercises: Standing   Protraction  Strengthening;Right;10 reps;20 reps;Theraband x30    Theraband Level (Shoulder Protraction)  Level 1 (Yellow)    External Rotation  Strengthening;Right;10 reps;20 reps;Theraband    Theraband Level (Shoulder External Rotation)  Level 1 (Yellow)    Internal Rotation  Strengthening;Right;10 reps;20 reps;Theraband x30    Theraband Level (Shoulder Internal Rotation)  Level 2 (Red)    ABduction  AAROM;Right;20 reps with PVC    Extension  Strengthening;Right;10 reps;20 reps;Theraband x30    Theraband Level (Shoulder Extension)  Level 1 (Yellow)    Row  AROM;Right;Theraband x30    Theraband Level (Shoulder Row)  Level 2 (Red)      Shoulder Exercises: Pulleys   Flexion  3 minutes      Shoulder Exercises: ROM/Strengthening   UBE (Upper Arm Bike)  90 RPM 8 (4 fwd, 4bkwd)      Manual Therapy   Manual Therapy  Soft tissue mobilization;Passive ROM    Soft tissue mobilization  STW to R bicep, axilla, lateral pectoralis to reduce muscle tightness    Passive ROM  PROM into abduction with STW/M to decrease tightness                  PT Long Term Goals - 02/03/18 1158      PT LONG TERM GOAL #1   Title  Patient will be independent with HEP    Time  8    Period  Weeks    Status  On-going      PT LONG TERM GOAL #2   Title  Patient will improve right grip strength to  60 lb of force or greater for improved function.    Time  6    Period  Weeks    Status  New      PT LONG TERM GOAL #3   Title  Patient will improve right shoulder strength to 5/5 for stability during functional and work activities, when medically cleared to return to work.    Time  6    Period  Weeks    Status  New      PT LONG TERM GOAL #4   Title  Patient will improve right shoulder abduction AROM to WNL to improve ability to perform ADLs and functional tasks.    Time  6    Period  Weeks    Status  New            Plan - 02/03/18 1156    Clinical Impression Statement  Pt was able to tolerate all exercises well with verbal cues to slow movments and concentrate on form/techniques. Performed PROM and STW to R shoulder, pectoroalis and scar adhesions to decrease pt's stiffness/tightness. Pt reporting feeling better at end of session.     Rehab Potential  Excellent    PT Frequency  3x / week    PT Duration  8 weeks    PT Treatment/Interventions  Electrical Stimulation;Cryotherapy;Moist  Heat;Iontophoresis 4mg /ml Dexamethasone;Neuromuscular re-education;Therapeutic exercise;Therapeutic activities;Passive range of motion;Manual techniques;Taping;Vasopneumatic Device;ADLs/Self Care Home Management;Ultrasound    PT Next Visit Plan  Progress with shoulder strengthening with planks on elbows as well as more postural strengthening per protocol in media. Shoulder stability exercises    PT Home Exercise Plan  scapular retactions, shoulder abd table slides,     Consulted and Agree with Plan of Care  Patient       Patient will benefit from skilled therapeutic intervention in order to improve the following deficits and impairments:  Impaired UE functional use, Decreased strength, Decreased range of motion, Decreased activity tolerance, Decreased endurance  Visit Diagnosis: Stiffness of right shoulder, not elsewhere classified     Problem List Patient Active Problem List   Diagnosis Date Noted  . Chlamydia infection 01/03/2017  . Abnormal uterine bleeding (AUB) 12/31/2016  . IUD (intrauterine device) in place 12/31/2016  . LLQ pain 12/31/2016  . Encounter for IUD insertion 08/31/2016    Sharmon Leyden, MPT 02/03/2018, 12:09 PM  [facility] [address] [place], [place], [address] Phone: [phone]   Fax:  [phone]  Name: Bethany Navarro MRN: [id number] Date of Birth: Apr 26, 1996

## 2018-02-06 ENCOUNTER — Encounter: Payer: Self-pay | Admitting: Physical Therapy

## 2018-02-06 ENCOUNTER — Ambulatory Visit: Payer: Worker's Compensation | Admitting: Physical Therapy

## 2018-02-06 DIAGNOSIS — M25611 Stiffness of right shoulder, not elsewhere classified: Secondary | ICD-10-CM

## 2018-02-06 DIAGNOSIS — G540 Brachial plexus disorders: Secondary | ICD-10-CM | POA: Diagnosis not present

## 2018-02-06 NOTE — Therapy (Signed)
[facility] [address] [place], [place], [address] Phone: [phone]   Fax:  [phone]  Physical Therapy Treatment  Patient Details  Name: Bethany Navarro MRN: [id number] Date of Birth: 10-Aug-1996 Referring Provider: Valora PiccoloJulie Ann Freischlag   Encounter Date: 02/06/2018  PT End of Session - 02/06/18 1123    Visit Number  6    Number of Visits  24    Date for PT Re-Evaluation  03/29/18    Authorization - Visit Number  6    Authorization - Number of Visits  24    PT Start Time  1115    PT Stop Time  1201    PT Time Calculation (min)  46 min    Activity Tolerance  Patient tolerated treatment well    Behavior During Therapy  [facility] for tasks assessed/performed       Past Medical History:  Diagnosis Date  . Back pain   . Chlamydia infection 01/03/2017  . Neck pain   . Shoulder pain, right    frozen shoulder    Past Surgical History:  Procedure Laterality Date  . NO PAST SURGERIES      There were no vitals filed for this visit.  Subjective Assessment - 02/06/18 1136    Subjective  Patient reported feeling a very sore last treatment but reported feeling fine the following day.    Limitations  Lifting;House hold activities    Diagnostic tests  MRI, CT, Myelogram    Patient Stated Goals  Get back to normal activities and work.    Currently in Pain?  No/denies         [facility] - 02/06/18 0001      Assessment   Medical Diagnosis  Right thoracic outlet syndrome surgery    Onset Date/Surgical Date  11/02/17    Hand Dominance  Right    Next MD Visit  03/23/2018    Prior Therapy  Yes                   [facility] Adult PT Treatment/Exercise - 02/06/18 0001      Shoulder Exercises: Supine   Protraction  AROM;Right;10 reps;20 reps x30    ABduction  AAROM;Right;20 reps with PVC      Shoulder Exercises: Standing   Protraction  Strengthening;Right;10 reps;20 reps;Theraband x30    Theraband Level  (Shoulder Protraction)  Level 1 (Yellow)    External Rotation  Strengthening;Right;10 reps;20 reps;Theraband x30    Theraband Level (Shoulder External Rotation)  Level 1 (Yellow)    Internal Rotation  Strengthening;Right;10 reps;20 reps;Theraband x30    Theraband Level (Shoulder Internal Rotation)  Level 2 (Red)    Extension  Strengthening;Right;10 reps;20 reps;Theraband x30    Theraband Level (Shoulder Extension)  Level 1 (Yellow)    Row  AROM;Right;Theraband x30    Theraband Level (Shoulder Row)  Level 1 (Yellow)    Other Standing Exercises  scapular clocks (12, 3, 6) x10 yellow TB      Shoulder Exercises: ROM/Strengthening   UBE (Upper Arm Bike)  90 RPM 8 (4 fwd, 4bkwd)      Manual Therapy   Manual Therapy  Soft tissue mobilization;Passive ROM    Soft tissue mobilization  STW to R bicep, IASTM/STW/M axilla, lateral pectoralis to reduce muscle tightness    Passive ROM  PROM into abduction with STW/M to decrease tightness                  PT Long Term  Goals - 02/03/18 1158      PT LONG TERM GOAL #1   Title  Patient will be independent with HEP    Time  8    Period  Weeks    Status  On-going      PT LONG TERM GOAL #2   Title  Patient will improve right grip strength to 60 lb of force or greater for improved function.    Time  6    Period  Weeks    Status  New      PT LONG TERM GOAL #3   Title  Patient will improve right shoulder strength to 5/5 for stability during functional and work activities, when medically cleared to return to work.    Time  6    Period  Weeks    Status  New      PT LONG TERM GOAL #4   Title  Patient will improve right shoulder abduction AROM to WNL to improve ability to perform ADLs and functional tasks.    Time  6    Period  Weeks    Status  New            Plan - 02/06/18 1208    Clinical Impression Statement  Patient was able to tolerate treatment well with good form with all exercises. Patient noted with increased  axilla and  pectoralis tightness today and was only able to actively abduct to approximately 85 degrees. Patient reported IASTM "felt good". Good response after manual therapy technique. Patient was able to demonstrate full right shoulder active abduction after STW/M.     Clinical Presentation  Stable    Clinical Decision Making  Low    Rehab Potential  Excellent    PT Frequency  3x / week    PT Duration  8 weeks    PT Treatment/Interventions  Electrical Stimulation;Cryotherapy;Moist Heat;Iontophoresis 4mg /ml Dexamethasone;Neuromuscular re-education;Therapeutic exercise;Therapeutic activities;Passive range of motion;Manual techniques;Taping;Vasopneumatic Device;ADLs/Self Care Home Management;Ultrasound    PT Next Visit Plan  Progress with shoulder strengthening with planks on elbows as well as more postural strengthening per protocol in media. Shoulder stability exercises    Consulted and Agree with Plan of Care  Patient       Patient will benefit from skilled therapeutic intervention in order to improve the following deficits and impairments:  Impaired UE functional use, Decreased strength, Decreased range of motion, Decreased activity tolerance, Decreased endurance  Visit Diagnosis: Stiffness of right shoulder, not elsewhere classified     Problem List Patient Active Problem List   Diagnosis Date Noted  . Chlamydia infection 01/03/2017  . Abnormal uterine bleeding (AUB) 12/31/2016  . IUD (intrauterine device) in place 12/31/2016  . LLQ pain 12/31/2016  . Encounter for IUD insertion 08/31/2016   Guss Bunde, PT, DPT 02/06/2018, 12:12 PM  [facility] [address] [place], [place], [address] Phone: [phone]   Fax:  [phone]  Name: Bethany Navarro MRN: [id number] Date of Birth: 02/14/1996

## 2018-02-08 ENCOUNTER — Encounter: Payer: Self-pay | Admitting: Physical Therapy

## 2018-02-08 ENCOUNTER — Ambulatory Visit: Payer: Worker's Compensation | Admitting: Physical Therapy

## 2018-02-08 DIAGNOSIS — M25611 Stiffness of right shoulder, not elsewhere classified: Secondary | ICD-10-CM

## 2018-02-08 DIAGNOSIS — G540 Brachial plexus disorders: Secondary | ICD-10-CM | POA: Diagnosis not present

## 2018-02-08 NOTE — Therapy (Signed)
[facility] [address] [place], [place], [address] Phone: [phone]   Fax:  [phone]  Physical Therapy Treatment  Patient Details  Name: Bethany Navarro MRN: [id number] Date of Birth: 09/27/1995 Referring Provider: Valora Piccolo Freischlag   Encounter Date: 02/08/2018  PT End of Session - 02/08/18 1121    Visit Number  7    Number of Visits  24    Date for PT Re-Evaluation  03/29/18    Authorization - Visit Number  7    Authorization - Number of Visits  24    PT Start Time  1119    PT Stop Time  1205    PT Time Calculation (min)  46 min    Activity Tolerance  Patient tolerated treatment well    Behavior During Therapy  [facility] for tasks assessed/performed       Past Medical History:  Diagnosis Date  . Back pain   . Chlamydia infection 01/03/2017  . Neck pain   . Shoulder pain, right    frozen shoulder    Past Surgical History:  Procedure Laterality Date  . NO PAST SURGERIES      There were no vitals filed for this visit.  Subjective Assessment - 02/08/18 1136    Subjective  Patient reports feeling yesterday sore and tight today.    Limitations  Lifting;House hold activities    Diagnostic tests  MRI, CT, Myelogram         [facility] PT Assessment - 02/08/18 0001      Assessment   Medical Diagnosis  Right thoracic outlet syndrome surgery    Onset Date/Surgical Date  11/02/17    Hand Dominance  Right    Next MD Visit  03/23/2018    Prior Therapy  Yes                   [facility] Adult PT Treatment/Exercise - 02/08/18 0001      Exercises   Exercises  Shoulder      Shoulder Exercises: Standing   Protraction  Strengthening;Right;10 reps;20 reps;Theraband    Theraband Level (Shoulder Protraction)  Level 1 (Yellow)    External Rotation  Strengthening;Right;10 reps;20 reps;Theraband    Theraband Level (Shoulder External Rotation)  Level 1 (Yellow)    Internal Rotation  Strengthening;Right;10 reps;20  reps;Theraband    Theraband Level (Shoulder Internal Rotation)  Level 2 (Red)    Extension  Strengthening;Right;10 reps;20 reps;Theraband    Theraband Level (Shoulder Extension)  Level 2 (Red)      Shoulder Exercises: ROM/Strengthening   UBE (Upper Arm Bike)  90 RPM 8 (4 fwd, 4bkwd)    Plank  Other (comment);3 reps until fatigue; followed by tall plank with shoulder tap x10      Manual Therapy   Manual Therapy  Soft tissue mobilization;Passive ROM    Soft tissue mobilization  STW/M axilla, lateral pectoralis and to reduce muscle tightness    Passive ROM  PROM into abduction with STW/M to decrease tightness                  PT Long Term Goals - 02/03/18 1158      PT LONG TERM GOAL #1   Title  Patient will be independent with HEP    Time  8    Period  Weeks    Status  On-going      PT LONG TERM GOAL #2   Title  Patient will improve right grip strength to 60 lb  of force or greater for improved function.    Time  6    Period  Weeks    Status  New      PT LONG TERM GOAL #3   Title  Patient will improve right shoulder strength to 5/5 for stability during functional and work activities, when medically cleared to return to work.    Time  6    Period  Weeks    Status  New      PT LONG TERM GOAL #4   Title  Patient will improve right shoulder abduction AROM to WNL to improve ability to perform ADLs and functional tasks.    Time  6    Period  Weeks    Status  New            Plan - 02/08/18 1335    Clinical Impression Statement  Patient was able to tolerate treatment well. Patient continues to have difficulties with AROM abduction prior  to STW/M. Patient demonstrates roughly 85-90 degrees of AROM shoulder abduction. Patient still noted with muscle adhesions under axilla but reduced after STW/M. Patient able to demonstrate full ROM despite some reports of tightness in axilla at end of session.    Clinical Presentation  Stable    Clinical Decision Making  Low     Rehab Potential  Excellent    PT Frequency  3x / week    PT Duration  8 weeks    PT Treatment/Interventions  Electrical Stimulation;Cryotherapy;Moist Heat;Iontophoresis 4mg /ml Dexamethasone;Neuromuscular re-education;Therapeutic exercise;Therapeutic activities;Passive range of motion;Manual techniques;Taping;Vasopneumatic Device;ADLs/Self Care Home Management;Ultrasound    PT Next Visit Plan  Assess ROM and strength, MD note next visit, continue with strengthening and scapular mobility    PT Home Exercise Plan  Planks    Consulted and Agree with Plan of Care  Patient       Patient will benefit from skilled therapeutic intervention in order to improve the following deficits and impairments:  Impaired UE functional use, Decreased strength, Decreased range of motion, Decreased activity tolerance, Decreased endurance  Visit Diagnosis: Stiffness of right shoulder, not elsewhere classified     Problem List Patient Active Problem List   Diagnosis Date Noted  . Chlamydia infection 01/03/2017  . Abnormal uterine bleeding (AUB) 12/31/2016  . IUD (intrauterine device) in place 12/31/2016  . LLQ pain 12/31/2016  . Encounter for IUD insertion 08/31/2016    Guss BundeKrystle Nani Ingram, PT, DPT 02/08/2018, 3:46 PM  [facility] [address] [place], [place], [address] Phone: [phone]   Fax:  [phone]  Name: Bethany Navarro MRN: [id number] Date of Birth: [birth year]

## 2018-02-10 ENCOUNTER — Ambulatory Visit: Payer: Worker's Compensation | Admitting: Physical Therapy

## 2018-02-10 DIAGNOSIS — M25611 Stiffness of right shoulder, not elsewhere classified: Secondary | ICD-10-CM

## 2018-02-10 DIAGNOSIS — G540 Brachial plexus disorders: Secondary | ICD-10-CM | POA: Diagnosis not present

## 2018-02-10 NOTE — Therapy (Signed)
[facility] [address] [place], [place], [address] Phone: [phone]   Fax:  [phone]  Physical Therapy Treatment  Patient Details  Name: Bethany Navarro MRN: [id number] Date of Birth: 07-29-1996 Referring Provider: Valora Piccolo Freischlag   Encounter Date: 02/10/2018  PT End of Session - 02/10/18 1215    Visit Number  8    Number of Visits  24    Date for PT Re-Evaluation  03/29/18    Authorization - Visit Number  8    Authorization - Number of Visits  24    PT Start Time  1119    PT Stop Time  1206    PT Time Calculation (min)  47 min    Activity Tolerance  Patient tolerated treatment well    Behavior During Therapy  [facility] for tasks assessed/performed       Past Medical History:  Diagnosis Date  . Back pain   . Chlamydia infection 01/03/2017  . Neck pain   . Shoulder pain, right    frozen shoulder    Past Surgical History:  Procedure Laterality Date  . NO PAST SURGERIES      There were no vitals filed for this visit.      [facility] PT Assessment - 02/10/18 0001      Assessment   Medical Diagnosis  Right thoracic outlet syndrome surgery    Onset Date/Surgical Date  11/02/17    Hand Dominance  Right    Next MD Visit  03/23/2018    Prior Therapy  Yes      Precautions   Precautions  --      AROM   Overall AROM Comments  WFL shoulder flexion, ER, and IR; WNL cervical ROM    Right Shoulder ABduction  120 Degrees      PROM   Right/Left Shoulder  Right    Right Shoulder Flexion  180 Degrees full PROM      Strength   Right Shoulder Flexion  4+/5    Right Shoulder ABduction  4+/5    Right Shoulder Internal Rotation  4+/5    Right Shoulder External Rotation  5/5                   [facility] Adult PT Treatment/Exercise - 02/10/18 0001      Exercises   Exercises  Shoulder      Shoulder Exercises: Standing   Protraction  Strengthening;Right;10 reps;20 reps;Theraband    Theraband Level (Shoulder  Protraction)  Level 1 (Yellow)    External Rotation  Strengthening;Right;10 reps;20 reps;Theraband    Theraband Level (Shoulder External Rotation)  Level 1 (Yellow)    Internal Rotation  Strengthening;Right;10 reps;20 reps;Theraband    Theraband Level (Shoulder Internal Rotation)  Level 2 (Red)    Extension  Strengthening;Right;10 reps;20 reps;Theraband    Theraband Level (Shoulder Extension)  Level 2 (Red)    Other Standing Exercises  scapular clocks (12, 3, 6) x15 yellow TB    Other Standing Exercises  wall ladder x10 to 28      Shoulder Exercises: ROM/Strengthening   UBE (Upper Arm Bike)  60 RPM 8 (4 fwd, 4bkwd)      Manual Therapy   Manual Therapy  Soft tissue mobilization;Passive ROM    Soft tissue mobilization  STW/M axilla, lateral pectoralis and to reduce muscle tightness    Passive ROM  PROM into abduction with STW/M to decrease tightness  PT Long Term Goals - 02/03/18 1158      PT LONG TERM GOAL #1   Title  Patient will be independent with HEP    Time  8    Period  Weeks    Status  On-going      PT LONG TERM GOAL #2   Title  Patient will improve right grip strength to 60 lb of force or greater for improved function.    Time  6    Period  Weeks    Status  New      PT LONG TERM GOAL #3   Title  Patient will improve right shoulder strength to 5/5 for stability during functional and work activities, when medically cleared to return to work.    Time  6    Period  Weeks    Status  New      PT LONG TERM GOAL #4   Title  Patient will improve right shoulder abduction AROM to WNL to improve ability to perform ADLs and functional tasks.    Time  6    Period  Weeks    Status  New            Plan - 02/10/18 1215    Clinical Impression Statement  Patient was able to tolerate treatment well with some reports of axilla and pectoralis tightness. Patient noted with increased tenderness to STW/M around incision and in axilla. Patient  demonstrates full PROM of right shoulder and  120 degrees ABD AROM. Patient's goals are ongoing.    Clinical Presentation  Stable    Clinical Decision Making  Low    Rehab Potential  Excellent    PT Frequency  3x / week    PT Duration  8 weeks    PT Treatment/Interventions  Electrical Stimulation;Cryotherapy;Moist Heat;Iontophoresis 4mg /ml Dexamethasone;Neuromuscular re-education;Therapeutic exercise;Therapeutic activities;Passive range of motion;Manual techniques;Taping;Vasopneumatic Device;ADLs/Self Care Home Management;Ultrasound    PT Next Visit Plan  continue with strengthening and scapular mobility    Consulted and Agree with Plan of Care  Patient       Patient will benefit from skilled therapeutic intervention in order to improve the following deficits and impairments:  Impaired UE functional use, Decreased strength, Decreased range of motion, Decreased activity tolerance, Decreased endurance  Visit Diagnosis: Stiffness of right shoulder, not elsewhere classified     Problem List Patient Active Problem List   Diagnosis Date Noted  . Chlamydia infection 01/03/2017  . Abnormal uterine bleeding (AUB) 12/31/2016  . IUD (intrauterine device) in place 12/31/2016  . LLQ pain 12/31/2016  . Encounter for IUD insertion 08/31/2016    Guss BundeKrystle Seraphim Affinito, PT, DPT 02/10/2018, 12:22 PM  [facility] [address] [place], [place], [address] Phone: [phone]   Fax:  [phone]  Name: [name] MRN: [id number] Date of Birth: [birth year]

## 2018-02-13 ENCOUNTER — Encounter: Payer: Self-pay | Admitting: Physical Therapy

## 2018-02-13 ENCOUNTER — Ambulatory Visit: Payer: Worker's Compensation | Attending: Vascular Surgery | Admitting: Physical Therapy

## 2018-02-13 DIAGNOSIS — M25611 Stiffness of right shoulder, not elsewhere classified: Secondary | ICD-10-CM | POA: Diagnosis present

## 2018-02-13 NOTE — Therapy (Signed)
[facility] [address] [place], [place], [address] Phone: [phone]   Fax:  [phone]  Physical Therapy Treatment  Patient Details  Name: Bethany Navarro MRN: [id number] Date of Birth: [birth year] Referring Provider: Valora Piccolo Freischlag   Encounter Date: 02/13/2018  PT End of Session - 02/13/18 1128    Visit Number  9    Number of Visits  24    Date for PT Re-Evaluation  03/29/18    Authorization - Visit Number  9    Authorization - Number of Visits  24    PT Start Time  1116    PT Stop Time  1158    PT Time Calculation (min)  42 min    Activity Tolerance  Patient tolerated treatment well    Behavior During Therapy  [facility] for tasks assessed/performed       Past Medical History:  Diagnosis Date  . Back pain   . Chlamydia infection 01/03/2017  . Neck pain   . Shoulder pain, right    frozen shoulder    Past Surgical History:  Procedure Laterality Date  . NO PAST SURGERIES      There were no vitals filed for this visit.  Subjective Assessment - 02/13/18 1124    Subjective  Reports a little tightness in posterior R shoulder.    Limitations  Lifting;House hold activities    Diagnostic tests  MRI, CT, Myelogram    Patient Stated Goals  Get back to normal activities and work.    Currently in Pain?  Yes    Pain Score  5     Pain Location  Shoulder    Pain Orientation  Right;Posterior    Pain Descriptors / Indicators  Tightness    Pain Type  Surgical pain    Pain Onset  More than a month ago         [facility] PT Assessment - 02/13/18 0001      Assessment   Medical Diagnosis  Right thoracic outlet syndrome surgery    Onset Date/Surgical Date  11/02/17    Hand Dominance  Right    Next MD Visit  03/23/2018    Prior Therapy  Yes                   [facility] Adult PT Treatment/Exercise - 02/13/18 0001      Shoulder Exercises: Standing   Protraction  Strengthening;Right;Theraband;Limitations    Theraband  Level (Shoulder Protraction)  Level 1 (Yellow)    Protraction Limitations  3x10 reps    External Rotation  Strengthening;Right;Theraband;Limitations    Theraband Level (Shoulder External Rotation)  Level 1 (Yellow)    External Rotation Limitations  3x10 reps    Internal Rotation  Strengthening;Right;Theraband;Limitations    Theraband Level (Shoulder Internal Rotation)  Level 2 (Red)    Internal Rotation Limitations  3x10 reps    Extension  Strengthening;Right;Theraband;Limitations    Theraband Level (Shoulder Extension)  Level 2 (Red)    Extension Limitations  3x10 reps    Other Standing Exercises  scapular clocks (12, 3, 6) x15 red TB; wall walks red theraband x3       Shoulder Exercises: ROM/Strengthening   UBE (Upper Arm Bike)  60 RPM x10 min    Wall Pushups  20 reps    X to V Arms  x20 reps      Shoulder Exercises: Stretch   Corner Stretch  3 reps;30 seconds    Table Stretch - Flexion  5  reps;10 seconds    Other Shoulder Stretches  chest stretch with elbow extension 3x30 sec      Manual Therapy   Manual Therapy  Soft tissue mobilization    Soft tissue mobilization  STW to R Teres, lats, axilla region to reduce muscle tightness                  PT Long Term Goals - 02/10/18 1224      PT LONG TERM GOAL #1   Title  Patient will be independent with HEP    Time  8    Period  Weeks    Status  On-going      PT LONG TERM GOAL #2   Title  Patient will improve right grip strength to 60 lb of force or greater for improved function.    Time  6    Period  Weeks    Status  On-going      PT LONG TERM GOAL #3   Title  Patient will improve right shoulder strength to 5/5 for stability during functional and work activities, when medically cleared to return to work.    Time  6    Period  Weeks    Status  On-going      PT LONG TERM GOAL #4   Title  Patient will improve right shoulder abduction AROM to WNL to improve ability to perform ADLs and functional tasks.    Time  6     Period  Weeks    Status  On-going            Plan - 02/13/18 1200    Clinical Impression Statement  Patient tolerated today's treatment well with no complaints of any increased pain with exercises only of reduced stability and control with RUE when LUE completing exercise such as wall walks. Patient also experienced weakness in abducction with red theraband. Manual therapy completed to teres, latissimus and axilla region to reduce muscle tightness with patient reporting her shoulder feeling less tight following treatment, Patient did experience popping in her chest region with chest stretches and crepitus in R shoulder.    Rehab Potential  Excellent    PT Frequency  3x / week    PT Duration  8 weeks    PT Treatment/Interventions  Electrical Stimulation;Cryotherapy;Moist Heat;Iontophoresis 4mg /ml Dexamethasone;Neuromuscular re-education;Therapeutic exercise;Therapeutic activities;Passive range of motion;Manual techniques;Taping;Vasopneumatic Device;ADLs/Self Care Home Management;Ultrasound    PT Next Visit Plan  continue with strengthening and scapular mobility    PT Home Exercise Plan  Planks    Consulted and Agree with Plan of Care  Patient       Patient will benefit from skilled therapeutic intervention in order to improve the following deficits and impairments:  Impaired UE functional use, Decreased strength, Decreased range of motion, Decreased activity tolerance, Decreased endurance  Visit Diagnosis: Stiffness of right shoulder, not elsewhere classified     Problem List Patient Active Problem List   Diagnosis Date Noted  . Chlamydia infection 01/03/2017  . Abnormal uterine bleeding (AUB) 12/31/2016  . IUD (intrauterine device) in place 12/31/2016  . LLQ pain 12/31/2016  . Encounter for IUD insertion 08/31/2016    Marvell FullerKelsey P Kennon, PTA 02/13/2018, 12:02 PM  [facility] [address] [place], [place], [address] Phone:  [phone]   Fax:  [phone]  Name: Bethany Navarro MRN: [id number] Date of Birth: 11-24-1995

## 2018-02-15 ENCOUNTER — Ambulatory Visit: Payer: Worker's Compensation | Admitting: Physical Therapy

## 2018-02-15 DIAGNOSIS — M25611 Stiffness of right shoulder, not elsewhere classified: Secondary | ICD-10-CM

## 2018-02-15 NOTE — Therapy (Signed)
[facility] [address] [place], [place], [address] Phone: [phone]   Fax:  [phone]  Physical Therapy Treatment  Patient Details  Name: Bethany CourserYasmine Danielle Navarro MRN: [id number] Date of Birth: [birth year] Referring Provider: Valora PiccoloJulie Ann Freischlag   Encounter Date: 02/15/2018  PT End of Session - 02/15/18 1129    Visit Number  10    Number of Visits  24    Date for PT Re-Evaluation  03/29/18    Authorization - Visit Number  10    Authorization - Number of Visits  24    PT Start Time  1117    PT Stop Time  1159    PT Time Calculation (min)  42 min    Activity Tolerance  Patient tolerated treatment well    Behavior During Therapy  [facility] for tasks assessed/performed       Past Medical History:  Diagnosis Date  . Back pain   . Chlamydia infection 01/03/2017  . Neck pain   . Shoulder pain, right    frozen shoulder    Past Surgical History:  Procedure Laterality Date  . NO PAST SURGERIES      There were no vitals filed for this visit.  Subjective Assessment - 02/15/18 1306    Subjective  Patient reported feeling "eh." She did not get much sleep last night.     Limitations  Lifting;House hold activities    Diagnostic tests  MRI, CT, Myelogram    Patient Stated Goals  Get back to normal activities and work.    Currently in Pain?  No/denies no pain, just tight         [facility] - 02/15/18 0001      Assessment   Medical Diagnosis  Right thoracic outlet syndrome surgery    Onset Date/Surgical Date  11/02/17    Hand Dominance  Right    Next MD Visit  03/23/2018    Prior Therapy  Yes                   [facility] Adult PT Treatment/Exercise - 02/15/18 0001      Shoulder Exercises: Standing   Protraction  Strengthening;Right;Theraband;Limitations    Theraband Level (Shoulder Protraction)  Level 1 (Yellow)    Protraction Limitations  3x10 reps    External Rotation  Strengthening;Right;Theraband;Limitations     Theraband Level (Shoulder External Rotation)  Level 1 (Yellow)    External Rotation Limitations  3x10 reps    Internal Rotation  Strengthening;Right;Theraband;Limitations    Theraband Level (Shoulder Internal Rotation)  Level 2 (Red)    Internal Rotation Limitations  3x10 reps    Extension  Strengthening;Right;Theraband;Limitations    Theraband Level (Shoulder Extension)  Level 2 (Red)    Extension Limitations  3x10 reps    Other Standing Exercises  scapular clocks (12, 1, 3, 6) x15 red TB; wall walks red theraband x3     Other Standing Exercises  downward dog x15      Shoulder Exercises: ROM/Strengthening   UBE (Upper Arm Bike)  60 RPM x8 min    X to V Arms  x20 reps      Manual Therapy   Manual Therapy  Soft tissue mobilization    Soft tissue mobilization  STW to R petctorals, Teres, lats, axilla region to reduce muscle tightness                  PT Long Term Goals - 02/10/18 1224  PT LONG TERM GOAL #1   Title  Patient will be independent with HEP    Time  8    Period  Weeks    Status  On-going      PT LONG TERM GOAL #2   Title  Patient will improve right grip strength to 60 lb of force or greater for improved function.    Time  6    Period  Weeks    Status  On-going      PT LONG TERM GOAL #3   Title  Patient will improve right shoulder strength to 5/5 for stability during functional and work activities, when medically cleared to return to work.    Time  6    Period  Weeks    Status  On-going      PT LONG TERM GOAL #4   Title  Patient will improve right shoulder abduction AROM to WNL to improve ability to perform ADLs and functional tasks.    Time  6    Period  Weeks    Status  On-going            Plan - 02/15/18 1308    Clinical Impression Statement  Patient was able to tolerate treatment well today with some reports of soreness. Patient required cuing for proper form during downward dog and was able to demonstrate proper form after verbal  cuing. Patient noted with full AROM abduction.    Clinical Presentation  Stable    Clinical Decision Making  Low    Rehab Potential  Excellent    PT Frequency  3x / week    PT Duration  8 weeks    PT Treatment/Interventions  Electrical Stimulation;Cryotherapy;Moist Heat;Iontophoresis 4mg /ml Dexamethasone;Neuromuscular re-education;Therapeutic exercise;Therapeutic activities;Passive range of motion;Manual techniques;Taping;Vasopneumatic Device;ADLs/Self Care Home Management;Ultrasound    PT Next Visit Plan  continue with strengthening and scapular mobility    Consulted and Agree with Plan of Care  Patient       Patient will benefit from skilled therapeutic intervention in order to improve the following deficits and impairments:  Impaired UE functional use, Decreased strength, Decreased range of motion, Decreased activity tolerance, Decreased endurance  Visit Diagnosis: Stiffness of right shoulder, not elsewhere classified     Problem List Patient Active Problem List   Diagnosis Date Noted  . Chlamydia infection 01/03/2017  . Abnormal uterine bleeding (AUB) 12/31/2016  . IUD (intrauterine device) in place 12/31/2016  . LLQ pain 12/31/2016  . Encounter for IUD insertion 08/31/2016   Guss Bunde, PT, DPT 02/15/2018, 1:19 PM  [facility] [address] [place], [place], [address] Phone: [phone]   Fax:  [phone]  Name: Bethany Navarro MRN: [id number] Date of Birth: March 25, 1996

## 2018-02-17 ENCOUNTER — Ambulatory Visit: Payer: Worker's Compensation | Admitting: Physical Therapy

## 2018-02-20 ENCOUNTER — Ambulatory Visit: Payer: Worker's Compensation | Admitting: Physical Therapy

## 2018-02-20 ENCOUNTER — Encounter: Payer: Self-pay | Admitting: Physical Therapy

## 2018-02-20 DIAGNOSIS — M25611 Stiffness of right shoulder, not elsewhere classified: Secondary | ICD-10-CM | POA: Diagnosis not present

## 2018-02-20 NOTE — Therapy (Signed)
[facility] [address] [place], [place], [address] Phone: [phone]   Fax:  [phone]  Physical Therapy Treatment  Patient Details  Name: Bethany Navarro MRN: [id number] Date of Birth: 12/13/1995 Referring Provider: Valora Piccolo Freischlag   Encounter Date: 02/20/2018  PT End of Session - 02/20/18 1130    Visit Number  11    Number of Visits  24    Date for PT Re-Evaluation  03/29/18    Authorization - Visit Number  11    Authorization - Number of Visits  24    PT Start Time  1115    PT Stop Time  1203    PT Time Calculation (min)  48 min    Activity Tolerance  Patient tolerated treatment well    Behavior During Therapy  [facility] for tasks assessed/performed       Past Medical History:  Diagnosis Date  . Back pain   . Chlamydia infection 01/03/2017  . Neck pain   . Shoulder pain, right    frozen shoulder    Past Surgical History:  Procedure Laterality Date  . NO PAST SURGERIES      There were no vitals filed for this visit.  Subjective Assessment - 02/20/18 1130    Subjective  Reported no new complaints; no pain just tight.    Limitations  Lifting;House hold activities    Diagnostic tests  MRI, CT, Myelogram    Patient Stated Goals  Get back to normal activities and work.    Currently in Pain?  No/denies         [facility] PT Assessment - 02/20/18 0001      Assessment   Medical Diagnosis  Right thoracic outlet syndrome surgery    Onset Date/Surgical Date  11/02/17    Hand Dominance  Right    Next MD Visit  03/15/2018    Prior Therapy  Yes        Progress Note Reporting Period  01/25/18 to 02/20/18  See note below for Objective Data and Assessment of Progress/Goals.                 [facility] Adult PT Treatment/Exercise - 02/20/18 0001      Shoulder Exercises: Standing   Protraction  Strengthening;Right;Theraband;Limitations    Theraband Level (Shoulder Protraction)  Level 1 (Yellow)    Protraction  Limitations  4x10 reps    External Rotation  Strengthening;Right;Theraband;Limitations    Theraband Level (Shoulder External Rotation)  Level 1 (Yellow)    External Rotation Limitations  4x10    Internal Rotation  Strengthening;Right;Theraband;Limitations    Theraband Level (Shoulder Internal Rotation)  Level 2 (Red)    Internal Rotation Limitations  4x10 reps    Extension  Strengthening;Right;Theraband;Limitations    Theraband Level (Shoulder Extension)  Level 2 (Red)    Extension Limitations  4x10 reps      Shoulder Exercises: ROM/Strengthening   UBE (Upper Arm Bike)  60 RPM x10 min    Wall Pushups  20 reps  (Pended)  push up plus    Pushups  --  (Pended)     Plank  Other (comment);3 reps until fatigue; tall planks with shoulder tap 2x10    X to V Arms  x20 reps      Manual Therapy   Manual Therapy  Soft tissue mobilization  (Pended)     Soft tissue mobilization  STW to R petctorals, Teres, lats, axilla region to reduce muscle tightness  (Pended)  PT Long Term Goals - 02/10/18 1224      PT LONG TERM GOAL #1   Title  Patient will be independent with HEP    Time  8    Period  Weeks    Status  On-going      PT LONG TERM GOAL #2   Title  Patient will improve right grip strength to 60 lb of force or greater for improved function.    Time  6    Period  Weeks    Status  On-going      PT LONG TERM GOAL #3   Title  Patient will improve right shoulder strength to 5/5 for stability during functional and work activities, when medically cleared to return to work.    Time  6    Period  Weeks    Status  On-going      PT LONG TERM GOAL #4   Title  Patient will improve right shoulder abduction AROM to WNL to improve ability to perform ADLs and functional tasks.    Time  6    Period  Weeks    Status  On-going            Plan - 02/20/18 1248    Clinical Impression Statement  Patient was able to tolerate progression of treatment well with some  reports of muscle fatigue and soreness. Patient educated importance of building up muscular endurance for ALDs, functional activities and work tasks. Patient noted with decreased pectoral muscle adhesions today and demonstrated full right shoulder active ROM abduction.     Clinical Presentation  Stable    Clinical Decision Making  Low    Rehab Potential  Excellent    PT Frequency  3x / week    PT Duration  8 weeks    PT Treatment/Interventions  Electrical Stimulation;Cryotherapy;Moist Heat;Iontophoresis 4mg /ml Dexamethasone;Neuromuscular re-education;Therapeutic exercise;Therapeutic activities;Passive range of motion;Manual techniques;Taping;Vasopneumatic Device;ADLs/Self Care Home Management;Ultrasound    PT Next Visit Plan  continue with strengthening and scapular mobility    Consulted and Agree with Plan of Care  Patient       Patient will benefit from skilled therapeutic intervention in order to improve the following deficits and impairments:  Impaired UE functional use, Decreased strength, Decreased range of motion, Decreased activity tolerance, Decreased endurance  Visit Diagnosis: Stiffness of right shoulder, not elsewhere classified     Problem List Patient Active Problem List   Diagnosis Date Noted  . Chlamydia infection 01/03/2017  . Abnormal uterine bleeding (AUB) 12/31/2016  . IUD (intrauterine device) in place 12/31/2016  . LLQ pain 12/31/2016  . Encounter for IUD insertion 08/31/2016   Guss BundeKrystle Ryo Klang, PT, DPT 02/20/2018, 12:51 PM  [facility] [address] [place], [place], [address] Phone: [phone]   Fax:  [phone]  Name: Bethany Navarro MRN: [id number] Date of Birth: [birth year]

## 2018-02-21 ENCOUNTER — Ambulatory Visit: Payer: Worker's Compensation | Admitting: Physical Therapy

## 2018-02-21 DIAGNOSIS — M25611 Stiffness of right shoulder, not elsewhere classified: Secondary | ICD-10-CM

## 2018-02-21 NOTE — Therapy (Signed)
[facility] [address] [place], [place], [address] Phone: [phone]   Fax:  [phone]  Physical Therapy Treatment  Patient Details  Name: Carolene Gitto Ward MRN: [id number] Date of Birth: 01-27-1996 Referring Provider: Valora Piccolo Freischlag   Encounter Date: 02/21/2018  PT End of Session - 02/21/18 1129    Visit Number  12    Number of Visits  24    Date for PT Re-Evaluation  03/29/18    Authorization - Visit Number  12    Authorization - Number of Visits  24    PT Start Time  1117    PT Stop Time  1203    PT Time Calculation (min)  46 min    Activity Tolerance  Patient tolerated treatment well    Behavior During Therapy  [facility] for tasks assessed/performed       Past Medical History:  Diagnosis Date  . Back pain   . Chlamydia infection 01/03/2017  . Neck pain   . Shoulder pain, right    frozen shoulder    Past Surgical History:  Procedure Laterality Date  . NO PAST SURGERIES      There were no vitals filed for this visit.  Subjective Assessment - 02/21/18 1130    Subjective  Patient reported feeling fine.    Limitations  Lifting;House hold activities    Diagnostic tests  MRI, CT, Myelogram    Patient Stated Goals  Get back to normal activities and work.    Currently in Pain?  No/denies         [facility] PT Assessment - 02/21/18 0001      Assessment   Medical Diagnosis  Right thoracic outlet syndrome surgery    Onset Date/Surgical Date  11/02/17    Hand Dominance  Right    Next MD Visit  03/15/2018    Prior Therapy  Yes                   [facility] Adult PT Treatment/Exercise - 02/21/18 0001      Shoulder Exercises: Standing   Protraction  Strengthening;Right;Theraband;Limitations    Theraband Level (Shoulder Protraction)  Level 1 (Yellow)    Protraction Limitations  4x10 reps    External Rotation  Strengthening;Right;Theraband;Limitations    Theraband Level (Shoulder External Rotation)  Level 1  (Yellow)    External Rotation Limitations  4x10    Internal Rotation  Strengthening;Right;Theraband;Limitations    Theraband Level (Shoulder Internal Rotation)  Level 2 (Red)    Internal Rotation Limitations  4x10 reps    Extension  Strengthening;Right;Theraband;Limitations    Theraband Level (Shoulder Extension)  Level 2 (Red)    Extension Limitations  4x10 reps    Other Standing Exercises  scapular clocks (12, 1, 3, 6) x15 red TB      Shoulder Exercises: ROM/Strengthening   UBE (Upper Arm Bike)  90 RPM x10 min    Pushups  5 reps on low plinth    Plank  Other (comment);1 rep until fatigue 1 min 17 seconds    Side Plank  Other (comment);2 reps until fatigue ~39 seconds    "W" Arms  x15 reps; against wall      Manual Therapy   Manual Therapy  Soft tissue mobilization    Soft tissue mobilization  STW to Teres, lats, axilla region to reduce muscle tightness                  PT Long Term Goals - 02/10/18 1224  PT LONG TERM GOAL #1   Title  Patient will be independent with HEP    Time  8    Period  Weeks    Status  On-going      PT LONG TERM GOAL #2   Title  Patient will improve right grip strength to 60 lb of force or greater for improved function.    Time  6    Period  Weeks    Status  On-going      PT LONG TERM GOAL #3   Title  Patient will improve right shoulder strength to 5/5 for stability during functional and work activities, when medically cleared to return to work.    Time  6    Period  Weeks    Status  On-going      PT LONG TERM GOAL #4   Title  Patient will improve right shoulder abduction AROM to WNL to improve ability to perform ADLs and functional tasks.    Time  6    Period  Weeks    Status  On-going            Plan - 02/21/18 1212    Clinical Impression Statement  Patient was able to tolerate treatment well despite reports of muscle fatigue with low plinth push ups. patient noted with lumbar ext. compensation with last rep therefore  exercise was terminated. Patient was noted with improved side plank time ~39 seconds. Patient noted with good response to STW/M to axilla and posterior shoulder.     Clinical Presentation  Stable    Clinical Decision Making  Low    Rehab Potential  Excellent    PT Frequency  3x / week    PT Duration  8 weeks    PT Treatment/Interventions  Electrical Stimulation;Cryotherapy;Moist Heat;Iontophoresis 4mg /ml Dexamethasone;Neuromuscular re-education;Therapeutic exercise;Therapeutic activities;Passive range of motion;Manual techniques;Taping;Vasopneumatic Device;ADLs/Self Care Home Management;Ultrasound    PT Next Visit Plan  continue with strengthening and scapular mobility    Consulted and Agree with Plan of Care  Patient       Patient will benefit from skilled therapeutic intervention in order to improve the following deficits and impairments:  Impaired UE functional use, Decreased strength, Decreased range of motion, Decreased activity tolerance, Decreased endurance  Visit Diagnosis: Stiffness of right shoulder, not elsewhere classified     Problem List Patient Active Problem List   Diagnosis Date Noted  . Chlamydia infection 01/03/2017  . Abnormal uterine bleeding (AUB) 12/31/2016  . IUD (intrauterine device) in place 12/31/2016  . LLQ pain 12/31/2016  . Encounter for IUD insertion 08/31/2016    Guss BundeKrystle Billiejo Sorto, PT, DPT 02/21/2018, 12:23 PM  [facility] [address] [place], [place], [address] Phone: [phone]   Fax:  [phone]  Name: Arnetha CourserYasmine Danielle Ward MRN: [id number] Date of Birth: [birth year]

## 2018-02-22 ENCOUNTER — Ambulatory Visit: Payer: Worker's Compensation | Admitting: Physical Therapy

## 2018-03-03 ENCOUNTER — Ambulatory Visit: Payer: Worker's Compensation | Admitting: Physical Therapy

## 2018-03-03 DIAGNOSIS — M25611 Stiffness of right shoulder, not elsewhere classified: Secondary | ICD-10-CM | POA: Diagnosis not present

## 2018-03-03 NOTE — Therapy (Signed)
[facility] [address] [place], [place], [address] Phone: [phone]   Fax:  [phone]  Physical Therapy Treatment  Patient Details  Name: Bethany Navarro MRN: [id number] Date of Birth: 1996-04-23 Referring Provider: Valora PiccoloJulie Ann Freischlag   Encounter Date: 03/03/2018  PT End of Session - 03/03/18 1123    Visit Number  13    Number of Visits  24    Date for PT Re-Evaluation  03/29/18    Authorization - Visit Number  13    Authorization - Number of Visits  24    PT Start Time  1118    PT Stop Time  1203    PT Time Calculation (min)  45 min    Activity Tolerance  Patient tolerated treatment well    Behavior During Therapy  [facility] for tasks assessed/performed       Past Medical History:  Diagnosis Date  . Back pain   . Chlamydia infection 01/03/2017  . Neck pain   . Shoulder pain, right    frozen shoulder    Past Surgical History:  Procedure Laterality Date  . NO PAST SURGERIES      There were no vitals filed for this visit.  Subjective Assessment - 03/03/18 1124    Subjective  Patient reported feeling tight and sore. [place] until fatigue to improve strength and ROM.    Limitations  Lifting;House hold activities    Diagnostic tests  MRI, CT, Myelogram    Patient Stated Goals  Get back to normal activities and work.    Currently in Pain?  No/denies         [facility] - 03/03/18 0001      Assessment   Medical Diagnosis  Right thoracic outlet syndrome surgery    Onset Date/Surgical Date  11/02/17    Hand Dominance  Right    Next MD Visit  03/15/2018    Prior Therapy  Yes                   [facility] Adult PT Treatment/Exercise - 03/03/18 0001      Shoulder Exercises: Standing   Protraction  Strengthening;Right;Theraband;Limitations    Theraband Level (Shoulder Protraction)  Level 1 (Yellow)    Protraction Limitations  4x10 reps    External Rotation   Strengthening;Right;Theraband;Limitations    Theraband Level (Shoulder External Rotation)  Level 1 (Yellow)    External Rotation Limitations  4x10    Internal Rotation  Strengthening;Right;Theraband;Limitations    Theraband Level (Shoulder Internal Rotation)  Level 2 (Red)    Internal Rotation Limitations  4x10 reps    Extension  Strengthening;Right;Theraband;Limitations    Theraband Level (Shoulder Extension)  Level 2 (Red)    Extension Limitations  4x10 reps    Other Standing Exercises  scapular clocks 90 degrees flexion (12, 3, 6) 2x15 yellow      Shoulder Exercises: ROM/Strengthening   UBE (Upper Arm Bike)  60 RPM x10 min    X to V Arms  2x15      Manual Therapy   Manual Therapy  Soft tissue mobilization    Soft tissue mobilization  STW to Teres, lats, axilla region to reduce muscle tightness                  PT Long Term Goals - 02/10/18 1224      PT LONG TERM GOAL #1   Title  Patient will be independent with HEP  Time  8    Period  Weeks    Status  On-going      PT LONG TERM GOAL #2   Title  Patient will improve right grip strength to 60 lb of force or greater for improved function.    Time  6    Period  Weeks    Status  On-going      PT LONG TERM GOAL #3   Title  Patient will improve right shoulder strength to 5/5 for stability during functional and work activities, when medically cleared to return to work.    Time  6    Period  Weeks    Status  On-going      PT LONG TERM GOAL #4   Title  Patient will improve right shoulder abduction AROM to WNL to improve ability to perform ADLs and functional tasks.    Time  6    Period  Weeks    Status  On-going            Plan - 03/03/18 1218    Clinical Impression Statement  Patient was able to tolerate treatment well however noted with increased muscle fatigue with exercises. Patient attempted resisted X to V shoulder exercise with yellow band however patient was unable to perform with full range.  Exercise performed without resistance. Patient able to tolerate exercise with rest. Patient noted with increased pectoralis adhesions particularly around collar bone.    Clinical Presentation  Stable    Clinical Decision Making  Low    Rehab Potential  Excellent    PT Frequency  3x / week    PT Duration  8 weeks    PT Treatment/Interventions  Electrical Stimulation;Cryotherapy;Moist Heat;Iontophoresis 4mg /ml Dexamethasone;Neuromuscular re-education;Therapeutic exercise;Therapeutic activities;Passive range of motion;Manual techniques;Taping;Vasopneumatic Device;ADLs/Self Care Home Management;Ultrasound    PT Next Visit Plan  continue with strengthening and scapular mobility    PT Home Exercise Plan  Planks    Consulted and Agree with Plan of Care  Patient       Patient will benefit from skilled therapeutic intervention in order to improve the following deficits and impairments:  Impaired UE functional use, Decreased strength, Decreased range of motion, Decreased activity tolerance, Decreased endurance  Visit Diagnosis: Stiffness of right shoulder, not elsewhere classified     Problem List Patient Active Problem List   Diagnosis Date Noted  . Chlamydia infection 01/03/2017  . Abnormal uterine bleeding (AUB) 12/31/2016  . IUD (intrauterine device) in place 12/31/2016  . LLQ pain 12/31/2016  . Encounter for IUD insertion 08/31/2016    Guss Bunde, PT, DPT 03/03/2018, 12:28 PM  [facility] [address] [place], [place], [address] Phone: [phone]   Fax:  [phone]  Name: Bethany Navarro MRN: [id number] Date of Birth: [birth year]

## 2018-03-06 ENCOUNTER — Ambulatory Visit: Payer: Worker's Compensation | Admitting: Physical Therapy

## 2018-03-06 DIAGNOSIS — M25611 Stiffness of right shoulder, not elsewhere classified: Secondary | ICD-10-CM

## 2018-03-06 NOTE — Therapy (Signed)
[facility] [address] [place], [place], [address] Phone: [phone]   Fax:  [phone]  Physical Therapy Treatment  Patient Details  Name: Bethany Navarro MRN: [id number] Date of Birth: 01-11-1996 Referring Provider: Valora PiccoloJulie Ann Freischlag   Encounter Date: 03/06/2018  PT End of Session - 03/06/18 1443    Visit Number  14    Number of Visits  24    Date for PT Re-Evaluation  03/29/18    Authorization - Visit Number  14    Authorization - Number of Visits  24    PT Start Time  1426    PT Stop Time  1515    PT Time Calculation (min)  49 min    Activity Tolerance  Patient tolerated treatment well    Behavior During Therapy  [facility] for tasks assessed/performed       Past Medical History:  Diagnosis Date  . Back pain   . Chlamydia infection 01/03/2017  . Neck pain   . Shoulder pain, right    frozen shoulder    Past Surgical History:  Procedure Laterality Date  . NO PAST SURGERIES      There were no vitals filed for this visit.  Subjective Assessment - 03/06/18 1859    Subjective  Patient reported feeling tight in right shoulder today.    Limitations  Lifting;House hold activities    Diagnostic tests  MRI, CT, Myelogram    Patient Stated Goals  Get back to normal activities and work.    Currently in Pain?  No/denies         [facility] - 03/06/18 0001      Assessment   Medical Diagnosis  Right thoracic outlet syndrome surgery    Onset Date/Surgical Date  11/02/17    Hand Dominance  Right    Next MD Visit  03/15/2018    Prior Therapy  Yes                   [facility] Adult PT Treatment/Exercise - 03/06/18 0001      Shoulder Exercises: Standing   Protraction  Strengthening;Right;Theraband;Limitations    Theraband Level (Shoulder Protraction)  Level 1 (Yellow)    Protraction Limitations  4x10 reps    External Rotation  Strengthening;Right;Theraband;Limitations    Theraband Level (Shoulder  External Rotation)  Level 2 (Red)    External Rotation Limitations  3x10    Internal Rotation  Strengthening;Right;Theraband;Limitations    Theraband Level (Shoulder Internal Rotation)  Level 2 (Red)    Internal Rotation Limitations  4x10 reps    Extension  Strengthening;Right;Theraband;Limitations    Theraband Level (Shoulder Extension)  Level 2 (Red)    Extension Limitations  4x10 reps    Row  Strengthening;Both;10 reps;20 reps;Theraband;Limitations;Other (comment) 3x10 with low squat Pink XTS    Row Limitations  Pink XTS    Other Standing Exercises  wood chop 2x10 bilaterally      Shoulder Exercises: ROM/Strengthening   UBE (Upper Arm Bike)  90 RPM x10 min    Wall Pushups  20 reps;10 reps 3x10    X to V Arms  2x15      Manual Therapy   Manual Therapy  Soft tissue mobilization    Soft tissue mobilization  STW to Teres, lats, axilla region to reduce muscle tightness    Passive ROM  PROM into abduction with STW/M to decrease tightness  PT Long Term Goals - 02/10/18 1224      PT LONG TERM GOAL #1   Title  Patient will be independent with HEP    Time  8    Period  Weeks    Status  On-going      PT LONG TERM GOAL #2   Title  Patient will improve right grip strength to 60 lb of force or greater for improved function.    Time  6    Period  Weeks    Status  On-going      PT LONG TERM GOAL #3   Title  Patient will improve right shoulder strength to 5/5 for stability during functional and work activities, when medically cleared to return to work.    Time  6    Period  Weeks    Status  On-going      PT LONG TERM GOAL #4   Title  Patient will improve right shoulder abduction AROM to WNL to improve ability to perform ADLs and functional tasks.    Time  6    Period  Weeks    Status  On-going            Plan - 03/06/18 1904    Clinical Impression Statement  Patient was able to tolerate treatment well but patient was noted with decreased right UE  abduction AROM at start of session. Patient reported "tightness" from 95 to 150 degrees and feels "free and loose" at 150+ degrees when performing shoulder abduction AAROM with PVC. Patient noted with increased muscle fasiculations with the eccentric control of the XTS rows but was able to complete exercise with rest. Patient noted with WNL right shoulder abduction AROM after STW/M to pectorials.     Clinical Presentation  Stable    Clinical Decision Making  Low    Rehab Potential  Excellent    PT Frequency  3x / week    PT Duration  8 weeks    PT Treatment/Interventions  Electrical Stimulation;Cryotherapy;Moist Heat;Iontophoresis 4mg /ml Dexamethasone;Neuromuscular re-education;Therapeutic exercise;Therapeutic activities;Passive range of motion;Manual techniques;Taping;Vasopneumatic Device;ADLs/Self Care Home Management;Ultrasound    PT Next Visit Plan  continue with strengthening and scapular mobility, begin work related strengthening, rows, box lifts, overhead lifts    Consulted and Agree with Plan of Care  Patient       Patient will benefit from skilled therapeutic intervention in order to improve the following deficits and impairments:  Impaired UE functional use, Decreased strength, Decreased range of motion, Decreased activity tolerance, Decreased endurance  Visit Diagnosis: Stiffness of right shoulder, not elsewhere classified     Problem List Patient Active Problem List   Diagnosis Date Noted  . Chlamydia infection 01/03/2017  . Abnormal uterine bleeding (AUB) 12/31/2016  . IUD (intrauterine device) in place 12/31/2016  . LLQ pain 12/31/2016  . Encounter for IUD insertion 08/31/2016   Bethany Navarro, PT, DPT 03/06/2018, 7:24 PM  [facility] [address] [place], [place], [address] Phone: [phone]   Fax:  [phone]  Name: Bethany Navarro MRN: [id number] Date of Birth: 09/03/1995

## 2018-03-08 ENCOUNTER — Ambulatory Visit: Payer: Worker's Compensation | Admitting: Physical Therapy

## 2018-03-08 ENCOUNTER — Encounter: Payer: Self-pay | Admitting: Physical Therapy

## 2018-03-08 DIAGNOSIS — M25611 Stiffness of right shoulder, not elsewhere classified: Secondary | ICD-10-CM | POA: Diagnosis not present

## 2018-03-08 NOTE — Therapy (Signed)
[facility] [address] [place], [place], [address] Phone: [phone]   Fax:  [phone]  Physical Therapy Treatment  Patient Details  Name: Bethany Navarro MRN: [id number] Date of Birth: 1996-02-27 Referring Provider: Valora Piccolo Freischlag   Encounter Date: 03/08/2018  PT End of Session - 03/08/18 1128    Visit Number  15    Number of Visits  24    Date for PT Re-Evaluation  03/29/18    Authorization - Visit Number  15    Authorization - Number of Visits  24    PT Start Time  1117    PT Stop Time  1200    PT Time Calculation (min)  43 min    Activity Tolerance  Patient tolerated treatment well    Behavior During Therapy  [facility] for tasks assessed/performed       Past Medical History:  Diagnosis Date  . Back pain   . Chlamydia infection 01/03/2017  . Neck pain   . Shoulder pain, right    frozen shoulder    Past Surgical History:  Procedure Laterality Date  . NO PAST SURGERIES      There were no vitals filed for this visit.  Subjective Assessment - 03/08/18 1127    Subjective  No fatigue or tightness today until UBE.    Limitations  Lifting;House hold activities    Diagnostic tests  MRI, CT, Myelogram    Patient Stated Goals  Get back to normal activities and work.    Currently in Pain?  No/denies         [facility] PT Assessment - 03/08/18 0001      Assessment   Medical Diagnosis  Right thoracic outlet syndrome surgery    Onset Date/Surgical Date  11/02/17    Hand Dominance  Right    Next MD Visit  03/15/2018    Prior Therapy  Yes                   [facility] Adult PT Treatment/Exercise - 03/08/18 0001      Shoulder Exercises: Standing   Protraction  Strengthening;Right;Theraband;Limitations    Theraband Level (Shoulder Protraction)  Level 1 (Yellow)    Protraction Limitations  4x10 reps    Horizontal ABduction  --    External Rotation  Strengthening;Right;Theraband;Limitations    Theraband Level  (Shoulder External Rotation)  Level 2 (Red)    External Rotation Limitations  3x10    Internal Rotation  Strengthening;Right;Theraband;Limitations    Theraband Level (Shoulder Internal Rotation)  Level 2 (Red)    Internal Rotation Limitations  4x10 reps    ABduction  Strengthening;Right;10 reps;Theraband    Theraband Level (Shoulder ABduction)  Level 1 (Yellow) within comfortable range with shoulder ER    Extension  Strengthening;Right;Theraband;Limitations    Theraband Level (Shoulder Extension)  Other (comment) Pink XTS with mini squat    Row  Strengthening;Both;Limitations xfatigue    Row Limitations  Pink XTS with mini squat    Other Standing Exercises  wood chop 2x10 bilaterally    Other Standing Exercises  R shoulder adduction red theraband xfatigue      Shoulder Exercises: ROM/Strengthening   UBE (Upper Arm Bike)  60 RPM x10 min    Wall Pushups  20 reps    "W" Arms  prone x20 reps    X to V Arms  x20 reps    Ball on Wall  RUE at shoulder height with light oscillations to ball x2 min  PT Long Term Goals - 02/10/18 1224      PT LONG TERM GOAL #1   Title  Patient will be independent with HEP    Time  8    Period  Weeks    Status  On-going      PT LONG TERM GOAL #2   Title  Patient will improve right grip strength to 60 lb of force or greater for improved function.    Time  6    Period  Weeks    Status  On-going      PT LONG TERM GOAL #3   Title  Patient will improve right shoulder strength to 5/5 for stability during functional and work activities, when medically cleared to return to work.    Time  6    Period  Weeks    Status  On-going      PT LONG TERM GOAL #4   Title  Patient will improve right shoulder abduction AROM to WNL to improve ability to perform ADLs and functional tasks.    Time  6    Period  Weeks    Status  On-going            Plan - 03/08/18 1204    Clinical Impression Statement  Patient tolerated today's  treatment well as she was progressed through more resisted R shoulder exercises. No pain or tightness reported upon beginning of treatment or following end of treatment. Patient reported disomfort with resisted shoulder abduction with yellow theraband and encouraged to complete within comfort range. Patient reported previous back pain and trips to chiropractor since she was 22 years old and following prone W back reported thoracic spine discomfort. Fairly good stabiliy with R shoulder ball on wall with light oscillations to ball.     Rehab Potential  Excellent    PT Frequency  3x / week    PT Duration  8 weeks    PT Treatment/Interventions  Electrical Stimulation;Cryotherapy;Moist Heat;Iontophoresis 4mg /ml Dexamethasone;Neuromuscular re-education;Therapeutic exercise;Therapeutic activities;Passive range of motion;Manual techniques;Taping;Vasopneumatic Device;ADLs/Self Care Home Management;Ultrasound    PT Next Visit Plan  continue with strengthening and scapular mobility, begin work related strengthening, rows, box lifts, overhead lifts    Consulted and Agree with Plan of Care  Patient       Patient will benefit from skilled therapeutic intervention in order to improve the following deficits and impairments:  Impaired UE functional use, Decreased strength, Decreased range of motion, Decreased activity tolerance, Decreased endurance  Visit Diagnosis: Stiffness of right shoulder, not elsewhere classified     Problem List Patient Active Problem List   Diagnosis Date Noted  . Chlamydia infection 01/03/2017  . Abnormal uterine bleeding (AUB) 12/31/2016  . IUD (intrauterine device) in place 12/31/2016  . LLQ pain 12/31/2016  . Encounter for IUD insertion 08/31/2016    Marvell FullerKelsey P Kennon, PTA 03/08/2018, 12:11 PM  [facility] [address] [place], [place], [address] Phone: [phone]   Fax:  [phone]  Name: Bethany CourserYasmine Danielle Navarro MRN:  [id number] Date of Birth: 06/11/1996

## 2018-03-09 ENCOUNTER — Ambulatory Visit: Payer: Worker's Compensation | Admitting: Physical Therapy

## 2018-03-21 ENCOUNTER — Encounter: Payer: Self-pay | Admitting: Physical Therapy

## 2018-03-21 ENCOUNTER — Ambulatory Visit: Payer: Worker's Compensation | Attending: Vascular Surgery | Admitting: Physical Therapy

## 2018-03-21 DIAGNOSIS — M25611 Stiffness of right shoulder, not elsewhere classified: Secondary | ICD-10-CM | POA: Diagnosis present

## 2018-03-21 NOTE — Therapy (Signed)
[facility] [address] [place], [place], [address] Phone: [phone]   Fax:  [phone]  Physical Therapy Treatment  Patient Details  Name: Bethany Navarro MRN: [id number] Date of Birth: 1996-04-09 Referring Provider: Valora PiccoloJulie Ann Freischlag   Encounter Date: 03/21/2018  PT End of Session - 03/21/18 1128    Visit Number  16    Number of Visits  48    Date for PT Re-Evaluation  05/11/18    Authorization - Visit Number  16    Authorization - Number of Visits  24    PT Start Time  1116    PT Stop Time  1203    PT Time Calculation (min)  47 min    Activity Tolerance  Patient tolerated treatment well    Behavior During Therapy  [facility] for tasks assessed/performed       Past Medical History:  Diagnosis Date  . Back pain   . Chlamydia infection 01/03/2017  . Neck pain   . Shoulder pain, right    frozen shoulder    Past Surgical History:  Procedure Laterality Date  . NO PAST SURGERIES      There were no vitals filed for this visit.  Subjective Assessment - 03/21/18 1130    Subjective  Patient reported Sept 26, finish PT visits  until release date.         [facility] 03/21/18 0001      Assessment   Medical Diagnosis  Right thoracic outlet syndrome surgery    Onset Date/Surgical Date  11/02/17    Hand Dominance  Right    Next MD Visit  TBD    Prior Therapy  Yes                   [facility] Adult PT Treatment/Exercise - 03/21/18 0001      Elbow Exercises   Elbow Flexion  Strengthening;Other reps (comment);Bar weights/barbell;Right 6#; until fatigue x2      Shoulder Exercises: Standing   Protraction  Strengthening;Right;Theraband;Limitations    Protraction Limitations  Pink XTS 4x10    Row  Strengthening;Both;Limitations    Row Limitations  Orange XTS with mini squat    Other Standing Exercises  wood chop 4x10 bilaterall orange XTS    Other Standing Exercises  R shoulder adduction red theraband  fatigue x2; drivers 6# [id number]" hold      Shoulder Exercises: ROM/Strengthening   UBE (Upper Arm Bike)  60 RPM x8 min    Pushups  10 reps 2x5                  PT Long Term Goals - 02/10/18 1224      PT LONG TERM GOAL #1   Title  Patient will be independent with HEP    Time  8    Period  Weeks    Status  On-going      PT LONG TERM GOAL #2   Title  Patient will improve right grip strength to 60 lb of force or greater for improved function.    Time  6    Period  Weeks    Status  On-going      PT LONG TERM GOAL #3   Title  Patient will improve right shoulder strength to 5/5 for stability during functional and work activities, when medically cleared to return to work.    Time  6    Period  Weeks    Status  On-going  PT LONG TERM GOAL #4   Title  Patient will improve right shoulder abduction AROM to WNL to improve ability to perform ADLs and functional tasks.    Time  6    Period  Weeks    Status  On-going            Plan - 03/21/18 1247    Clinical Impression Statement  Patient was able to tolerate treatment well with progression of exercises. Patient was able to perform exercises with rests but reported increased muscle fatigue. Patient and PT discussed emphasis on strength and work related exercises and STW/M PRN. Patient reported agreement.     Clinical Presentation  Stable    Clinical Decision Making  Low    Rehab Potential  Excellent    PT Frequency  3x / week    PT Duration  8 weeks    PT Treatment/Interventions  Electrical Stimulation;Cryotherapy;Moist Heat;Iontophoresis 4mg /ml Dexamethasone;Neuromuscular re-education;Therapeutic exercise;Therapeutic activities;Passive range of motion;Manual techniques;Taping;Vasopneumatic Device;ADLs/Self Care Home Management;Ultrasound    PT Next Visit Plan  continue with strengthening and scapular mobility, begin work related strengthening, rows, box lifts, overhead lifts    Consulted and Agree with Plan of Care   Patient       Patient will benefit from skilled therapeutic intervention in order to improve the following deficits and impairments:  Impaired UE functional use, Decreased strength, Decreased range of motion, Decreased activity tolerance, Decreased endurance  Visit Diagnosis: Stiffness of right shoulder, not elsewhere classified     Problem List Patient Active Problem List   Diagnosis Date Noted  . Chlamydia infection 01/03/2017  . Abnormal uterine bleeding (AUB) 12/31/2016  . IUD (intrauterine device) in place 12/31/2016  . LLQ pain 12/31/2016  . Encounter for IUD insertion 08/31/2016   Guss Bunde, PT, DPT 03/21/2018, 1:01 PM  [facility] [address] [place], [place], [address] Phone: [phone]   Fax:  [phone]  Name: Bethany Navarro MRN: [id number] Date of Birth: Aug 01, 1996

## 2018-03-23 ENCOUNTER — Ambulatory Visit: Payer: Worker's Compensation | Admitting: Physical Therapy

## 2018-03-23 DIAGNOSIS — M25611 Stiffness of right shoulder, not elsewhere classified: Secondary | ICD-10-CM | POA: Diagnosis not present

## 2018-03-23 NOTE — Therapy (Signed)
[facility] [address] [place], [place], [address] Phone: [phone]   Fax:  [phone]  Physical Therapy Treatment  Patient Details  Name: Bethany Navarro MRN: [id number] Date of Birth: Jan 27, 1996 Referring Provider: Valora PiccoloJulie Ann Freischlag   Encounter Date: 03/23/2018  PT End of Session - 03/23/18 1246    Visit Number  17    Number of Visits  48    Date for PT Re-Evaluation  05/11/18    Authorization - Visit Number  17    Authorization - Number of Visits  48    PT Start Time  1115    PT Stop Time  1203    PT Time Calculation (min)  48 min    Activity Tolerance  Patient tolerated treatment well    Behavior During Therapy  [facility] for tasks assessed/performed       Past Medical History:  Diagnosis Date  . Back pain   . Chlamydia infection 01/03/2017  . Neck pain   . Shoulder pain, right    frozen shoulder    Past Surgical History:  Procedure Laterality Date  . NO PAST SURGERIES      There were no vitals filed for this visit.  Subjective Assessment - 03/23/18 1244    Subjective  Patient reported feeling tired from being up since 5 am but shoulder otherwise feels good, just a little tight.    Limitations  Lifting;House hold activities    Diagnostic tests  MRI, CT, Myelogram    Patient Stated Goals  Get back to normal activities and work.    Currently in Pain?  No/denies         [facility] - 03/23/18 0001      Assessment   Medical Diagnosis  Right thoracic outlet syndrome surgery    Onset Date/Surgical Date  11/02/17    Hand Dominance  Right    Prior Therapy  Yes                   [facility] Adult PT Treatment/Exercise - 03/23/18 0001      Exercises   Exercises  Shoulder      Shoulder Exercises: Supine   Protraction  AROM;Right;20 reps;10 reps;Weights    Protraction Weight (lbs)  6      Shoulder Exercises: Standing   Protraction  Strengthening;Right;Theraband;Limitations    Protraction  Limitations  Pink XTS 4x10    Row  Strengthening;Both;Limitations    Row Limitations  Orange XTS with mini squat    Other Standing Exercises  mid rows 4x10 orange XTS      Shoulder Exercises: ROM/Strengthening   UBE (Upper Arm Bike)  60 RPM x10 min    Plank  Other (comment)    Other ROM/Strengthening Exercises  serratus punch with driver #6 [id number]      Manual Therapy   Manual Therapy  Myofascial release    Soft tissue mobilization  IASTM to Teres, lats, and pectoralis to reduce muscle tightness                  PT Long Term Goals - 02/10/18 1224      PT LONG TERM GOAL #1   Title  Patient will be independent with HEP    Time  8    Period  Weeks    Status  On-going      PT LONG TERM GOAL #2   Title  Patient will improve right grip strength to 60 lb of force or  greater for improved function.    Time  6    Period  Weeks    Status  On-going      PT LONG TERM GOAL #3   Title  Patient will improve right shoulder strength to 5/5 for stability during functional and work activities, when medically cleared to return to work.    Time  6    Period  Weeks    Status  On-going      PT LONG TERM GOAL #4   Title  Patient will improve right shoulder abduction AROM to WNL to improve ability to perform ADLs and functional tasks.    Time  6    Period  Weeks    Status  On-going            Plan - 03/23/18 1246    Clinical Impression Statement  Patient was able to tolerate treatment well with reports of increased muscle fatigue. Patient noted a slight decrease in shoulder AROM she has did not perform usual STW/M and stretching this morning. Patient noted with full range after session.    Clinical Presentation  Stable    Clinical Decision Making  Low    Rehab Potential  Excellent    PT Frequency  3x / week    PT Duration  8 weeks    PT Treatment/Interventions  Electrical Stimulation;Cryotherapy;Moist Heat;Iontophoresis 4mg /ml Dexamethasone;Neuromuscular re-education;Therapeutic  exercise;Therapeutic activities;Passive range of motion;Manual techniques;Taping;Vasopneumatic Device;ADLs/Self Care Home Management;Ultrasound    PT Next Visit Plan  continue with strengthening and scapular mobility, begin work related strengthening, rows, box lifts, overhead lifts    Consulted and Agree with Plan of Care  Patient       Patient will benefit from skilled therapeutic intervention in order to improve the following deficits and impairments:  Impaired UE functional use, Decreased strength, Decreased range of motion, Decreased activity tolerance, Decreased endurance  Visit Diagnosis: Stiffness of right shoulder, not elsewhere classified     Problem List Patient Active Problem List   Diagnosis Date Noted  . Chlamydia infection 01/03/2017  . Abnormal uterine bleeding (AUB) 12/31/2016  . IUD (intrauterine device) in place 12/31/2016  . LLQ pain 12/31/2016  . Encounter for IUD insertion 08/31/2016    Guss Bunde, PT, DPT 03/23/2018, 12:58 PM  [facility] [address] [place], [place], [address] Phone: [phone]   Fax:  [phone]  Name: Bethany Navarro MRN: [id number] Date of Birth: 1996-07-07

## 2018-03-27 ENCOUNTER — Ambulatory Visit: Payer: Worker's Compensation | Admitting: Physical Therapy

## 2018-03-27 ENCOUNTER — Encounter: Payer: Self-pay | Admitting: Physical Therapy

## 2018-03-27 DIAGNOSIS — M25611 Stiffness of right shoulder, not elsewhere classified: Secondary | ICD-10-CM

## 2018-03-27 NOTE — Therapy (Signed)
[facility] [address] [place], [place], [address] Phone: [phone]   Fax:  [phone]  Physical Therapy Treatment  Patient Details  Name: Bethany Navarro MRN: [id number] Date of Birth: 20-Aug-1995 Referring Provider: Valora PiccoloJulie Ann Freischlag   Encounter Date: 03/27/2018  PT End of Session - 03/27/18 1212    Visit Number  18    Number of Visits  48    Date for PT Re-Evaluation  05/11/18    Authorization - Visit Number  18    Authorization - Number of Visits  48    PT Start Time  1115    PT Stop Time  1200    PT Time Calculation (min)  45 min    Activity Tolerance  Patient tolerated treatment well    Behavior During Therapy  [facility] assessed/performed       Past Medical History:  Diagnosis Date  . Back pain   . Chlamydia infection 01/03/2017  . Neck pain   . Shoulder pain, right    frozen shoulder    Past Surgical History:  Procedure Laterality Date  . NO PAST SURGERIES      There were no vitals filed for this visit.  Subjective Assessment - 03/27/18 1211    Subjective  Pt reporting no pain upon arrival, but tighness noted in anterior shoulder.    Diagnostic tests  MRI, CT, Myelogram    Patient Stated Goals  Get back to normal activities and work.    Currently in Pain?  No/denies         [facility] - 03/27/18 0001      Assessment   Medical Diagnosis  Right thoracic outlet syndrome surgery    Onset Date/Surgical Date  11/02/17    Hand Dominance  Right    Prior Therapy  Yes                   [facility] Adult PT Treatment/Exercise - 03/27/18 0001      Exercises   Exercises  Shoulder      Shoulder Exercises: Supine   Protraction  AROM;Right;20 reps;10 reps;Weights    Protraction Weight (lbs)  --    Other Supine Exercises  serratus punches x 20 reps with 1# ball, clockwise motions x 2 reps      Shoulder Exercises: Prone   Other Prone Exercises  4 point planks with elbow extended  working on controlling scapula winging      Shoulder Exercises: Standing   Protraction  Strengthening;Right;Theraband;Limitations    Theraband Level (Shoulder Protraction)  Level 1 (Yellow)    Protraction Limitations  Pink XTS 4x10    Row  Strengthening;Both;Limitations   4x10   Row Limitations  Orange XTS with mini squat    Other Standing Exercises  DI and D2 with yellow theraband x 20 reps      Shoulder Exercises: ROM/Strengthening   UBE (Upper Arm Bike)  60 RPM x10 min      Manual Therapy   Manual Therapy  Joint mobilization;Myofascial release    Joint Mobilization  grade 2-3 shoulder joint mobs    Soft tissue mobilization  STW to anterior shoulder             PT Education - 03/27/18 1212    Education Details  serratus strengthening exercises to stabalize scapula    Person(s) Educated  Patient    Methods  Explanation;Demonstration;Verbal cues    Comprehension  Verbalized understanding;Returned demonstration  PT Long Term Goals - 03/27/18 1228      PT LONG TERM GOAL #1   Title  Patient will be independent with HEP    Time  8    Period  Weeks    Status  On-going      PT LONG TERM GOAL #2   Title  Patient will improve right grip strength to 60 lb of force or greater for improved function.    Period  Weeks    Status  On-going      PT LONG TERM GOAL #3   Title  Patient will improve right shoulder strength to 5/5 for stability during functional and work activities, when medically cleared to return to work.    Time  6    Period  Weeks      PT LONG TERM GOAL #4   Title  Patient will improve right shoulder abduction AROM to WNL to improve ability to perform ADLs and functional tasks.    Time  6    Period  Weeks    Status  On-going            Plan - 03/27/18 1213    Clinical Impression Statement  Pt tolerating all exercises well and reported that her MD set a release date in September. Pt still reporting tigtness in her R anterior shoulder and  popping noted intermittently. STW and shoulder mobs performed. Pt also with noted mild scapula winging bilaterally. Pt was edu in proper exercises and home program. Continue with skilled PT.     Rehab Potential  Excellent    PT Frequency  3x / week    PT Duration  8 weeks    PT Treatment/Interventions  Electrical Stimulation;Cryotherapy;Moist Heat;Iontophoresis 4mg /ml Dexamethasone;Neuromuscular re-education;Therapeutic exercise;Therapeutic activities;Passive range of motion;Manual techniques;Taping;Vasopneumatic Device;ADLs/Self Care Home Management;Ultrasound    PT Next Visit Plan  continue with strengthening and scapular mobility, begin work related strengthening, rows, box lifts, overhead lifts    PT Home Exercise Plan  Planks, serratus punches with 1-2# weight in clock pattern.     Consulted and Agree with Plan of Care  Patient       Patient will benefit from skilled therapeutic intervention in order to improve the following deficits and impairments:  Impaired UE functional use, Decreased strength, Decreased range of motion, Decreased activity tolerance, Decreased endurance  Visit Diagnosis: Stiffness of right shoulder, not elsewhere classified     Problem List Patient Active Problem List   Diagnosis Date Noted  . Chlamydia infection 01/03/2017  . Abnormal uterine bleeding (AUB) 12/31/2016  . IUD (intrauterine device) in place 12/31/2016  . LLQ pain 12/31/2016  . Encounter for IUD insertion 08/31/2016    Sharmon LeydenJennifer R Martin, MPT 03/27/2018, 12:29 PM  [facility] [address] [place], [place], [address] Phone: [phone]   Fax:  [phone]  Name: Bethany Navarro MRN: [id number] Date of Birth: 1995/09/13

## 2018-03-29 ENCOUNTER — Ambulatory Visit: Payer: Worker's Compensation | Admitting: Physical Therapy

## 2018-03-31 ENCOUNTER — Encounter: Payer: Self-pay | Admitting: Physical Therapy

## 2018-03-31 ENCOUNTER — Ambulatory Visit: Payer: Worker's Compensation | Admitting: Physical Therapy

## 2018-03-31 DIAGNOSIS — M25611 Stiffness of right shoulder, not elsewhere classified: Secondary | ICD-10-CM | POA: Diagnosis not present

## 2018-03-31 NOTE — Therapy (Signed)
[facility] [address] [place], [place], [address] Phone: [phone]   Fax:  [phone]  Physical Therapy Treatment  Patient Details  Name: Bethany Navarro MRN: [id number] Date of Birth: [birth year] Referring Provider: Valora PiccoloJulie Ann Freischlag   Encounter Date: 03/31/2018  PT End of Session - 03/31/18 1144    Visit Number  19    Number of Visits  48    Date for PT Re-Evaluation  05/11/18    Authorization - Visit Number  19    Authorization - Number of Visits  48    PT Start Time  1115    PT Stop Time  1203    PT Time Calculation (min)  48 min    Activity Tolerance  Patient tolerated treatment well    Behavior During Therapy  [facility] for tasks assessed/performed       Past Medical History:  Diagnosis Date  . Back pain   . Chlamydia infection 01/03/2017  . Neck pain   . Shoulder pain, right    frozen shoulder    Past Surgical History:  Procedure Laterality Date  . NO PAST SURGERIES      There were no vitals filed for this visit.  Subjective Assessment - 03/31/18 1140    Subjective  Patient reported feeling tight in the shoulder but no pain. She stated her back has been "locking up" which has prevented her from performing usual activities and ADLs.    Limitations  Lifting;House hold activities    Diagnostic tests  MRI, CT, Myelogram    Patient Stated Goals  Get back to normal activities and work.    Currently in Pain?  No/denies         [facility] - 03/31/18 0001      Assessment   Medical Diagnosis  Right thoracic outlet syndrome surgery    Onset Date/Surgical Date  11/02/17    Hand Dominance  Right    Prior Therapy  Yes                   [facility] Adult PT Treatment/Exercise - 03/31/18 0001      Exercises   Exercises  Shoulder      Shoulder Exercises: Supine   Other Supine Exercises  serratus punches x 20 reps with 1# ball, clockwise motions x 2 reps      Shoulder Exercises: Standing    Row  Strengthening;Both;Limitations   4x10   Row Limitations  Orange XTS with mini squat      Shoulder Exercises: ROM/Strengthening   UBE (Upper Arm Bike)  60 RPM x8 min    Rhythmic Stabilization, Supine  1# weight in various degrees of flexion, 120, 90 and 45 degrees. 30" x3 each      Manual Therapy   Manual Therapy  Joint mobilization;Myofascial release    Joint Mobilization  grade 2-3 posterior and inferior joint mobs    Soft tissue mobilization  STW to anterior shoulder                  PT Long Term Goals - 03/27/18 1228      PT LONG TERM GOAL #1   Title  Patient will be independent with HEP    Time  8    Period  Weeks    Status  On-going      PT LONG TERM GOAL #2   Title  Patient will improve right grip strength to 60 lb of force or greater for  improved function.    Period  Weeks    Status  On-going      PT LONG TERM GOAL #3   Title  Patient will improve right shoulder strength to 5/5 for stability during functional and work activities, when medically cleared to return to work.    Time  6    Period  Weeks      PT LONG TERM GOAL #4   Title  Patient will improve right shoulder abduction AROM to WNL to improve ability to perform ADLs and functional tasks.    Time  6    Period  Weeks    Status  On-going            Plan - 03/31/18 1221    Clinical Impression Statement  Patient was able to tolerate treatment despite reports of back tightness. Patient's exercises were emphasized on shoulder and scapular stability. Patient noted with improved form with minimal scapular winging with exercises but noted with increased muscle fatigue. Patient expressed she is somewhat concerned about returning to work with no restrictions. Patient stated MD had expressed her previous patient's have returned to work with no restrictions. PT instructed to ask case manager of options.    Clinical Presentation  Stable    Clinical Decision Making  Low    Rehab Potential  Excellent     PT Frequency  3x / week    PT Duration  8 weeks    PT Treatment/Interventions  Electrical Stimulation;Cryotherapy;Moist Heat;Iontophoresis 4mg /ml Dexamethasone;Neuromuscular re-education;Therapeutic exercise;Therapeutic activities;Passive range of motion;Manual techniques;Taping;Vasopneumatic Device;ADLs/Self Care Home Management;Ultrasound    PT Next Visit Plan  continue with strengthening and scapular mobility, begin work related strengthening, rows, box lifts, overhead lifts    Consulted and Agree with Plan of Care  Patient       Patient will benefit from skilled therapeutic intervention in order to improve the following deficits and impairments:  Impaired UE functional use, Decreased strength, Decreased range of motion, Decreased activity tolerance, Decreased endurance  Visit Diagnosis: Stiffness of right shoulder, not elsewhere classified     Problem List Patient Active Problem List   Diagnosis Date Noted  . Chlamydia infection 01/03/2017  . Abnormal uterine bleeding (AUB) 12/31/2016  . IUD (intrauterine device) in place 12/31/2016  . LLQ pain 12/31/2016  . Encounter for IUD insertion 08/31/2016    Guss BundeKrystle Chevella Pearce, PT, DPT 03/31/2018, 12:38 PM  [facility] [address] [place], [place], [address] Phone: [phone]   Fax:  [phone]  Name: Bethany Navarro MRN: [id number] Date of Birth: 12/02/1995

## 2018-04-03 ENCOUNTER — Encounter: Payer: Self-pay | Admitting: Physical Therapy

## 2018-04-03 ENCOUNTER — Ambulatory Visit: Payer: Worker's Compensation | Admitting: Physical Therapy

## 2018-04-03 DIAGNOSIS — M25611 Stiffness of right shoulder, not elsewhere classified: Secondary | ICD-10-CM

## 2018-04-03 NOTE — Therapy (Signed)
[facility] [address] [place], [place], [address] Phone: [phone]   Fax:  [phone]  Physical Therapy Treatment  Patient Details  Name: Bethany Navarro MRN: [id number] Date of Birth: May 09, 1996 Referring Provider: Valora PiccoloJulie Ann Freischlag   Encounter Date: 04/03/2018  PT End of Session - 04/03/18 1119    Visit Number  20    Number of Visits  48    Date for PT Re-Evaluation  05/11/18    Authorization - Visit Number  20    Authorization - Number of Visits  48    PT Start Time  1116    PT Stop Time  1157    PT Time Calculation (min)  41 min    Activity Tolerance  Patient tolerated treatment well    Behavior During Therapy  [facility] for tasks assessed/performed       Past Medical History:  Diagnosis Date  . Back pain   . Chlamydia infection 01/03/2017  . Neck pain   . Shoulder pain, right    frozen shoulder    Past Surgical History:  Procedure Laterality Date  . NO PAST SURGERIES      There were no vitals filed for this visit.  Subjective Assessment - 04/03/18 1118    Subjective  Reported tightness upon arrival and numbness occurs with activity.    Limitations  Lifting;House hold activities    Diagnostic tests  MRI, CT, Myelogram    Patient Stated Goals  Get back to normal activities and work.    Currently in Pain?  Yes    Pain Score  3     Pain Location  Shoulder    Pain Orientation  Right    Pain Descriptors / Indicators  Tightness    Pain Type  Surgical pain    Pain Onset  More than a month ago         [facility] - 04/03/18 0001      Assessment   Medical Diagnosis  Right thoracic outlet syndrome surgery    Onset Date/Surgical Date  11/02/17    Hand Dominance  Right    Prior Therapy  Yes      Precautions   Precautions  Shoulder      Restrictions   Weight Bearing Restrictions  No                   [facility] Adult PT Treatment/Exercise - 04/03/18 0001      Exercises   Exercises   Shoulder      Shoulder Exercises: Standing   Protraction  Strengthening;Both;Limitations   4x10 reps in squat   Protraction Limitations  Pink XTS 4x10    Horizontal ABduction  Strengthening;Both;Theraband;Limitations    Theraband Level (Shoulder Horizontal ABduction)  Level 3 (Green)    Horizontal ABduction Limitations  2x15 reps    ABduction  Strengthening;Right;Theraband;Limitations    Theraband Level (Shoulder ABduction)  Level 3 (Green)    ABduction Limitations  4x10 reps    Extension  Strengthening;Both;Limitations    Extension Limitations  4x10 reps in squat Pink XTS    Row  Strengthening;Both;Limitations    Row Limitations  Orange XTS 4x10 reps    Diagonals  Strengthening;Both;Limitations    Diagonals Limitations  Chop wood; 4x10 reps with Orange XTS    Other Standing Exercises  R shoulder adduction 4x10 reps green theraband    Other Standing Exercises  R shoulder flex, scap, abd 2# x5 reps   pain from R elbow  to anterior shoulder and posterior shoulde     Shoulder Exercises: ROM/Strengthening   UBE (Upper Arm Bike)  60 RPM x8 min                  PT Long Term Goals - 03/27/18 1228      PT LONG TERM GOAL #1   Title  Patient will be independent with HEP    Time  8    Period  Weeks    Status  On-going      PT LONG TERM GOAL #2   Title  Patient will improve right grip strength to 60 lb of force or greater for improved function.    Period  Weeks    Status  On-going      PT LONG TERM GOAL #3   Title  Patient will improve right shoulder strength to 5/5 for stability during functional and work activities, when medically cleared to return to work.    Time  6    Period  Weeks      PT LONG TERM GOAL #4   Title  Patient will improve right shoulder abduction AROM to WNL to improve ability to perform ADLs and functional tasks.    Time  6    Period  Weeks    Status  On-going            Plan - 04/03/18 1153    Clinical Impression Statement  Patient  tolerated today's treatment fairly well although she reported discomfort intermittantly throughout treatment. Patient experienced numbness with R shoulder abduction with green theraband but patient reported initally beginning with UBE. Patient reported continued numbness during PT session since green theraband abduction. Patient limited with R shoulder flex, scap, abduction circuit secondary to pain above 50 deg flexion for all exercises. Patient also experienced L shoulder popping during treatment as well. Upon end of treatment patient reported RUE numbness.    Rehab Potential  Excellent    PT Frequency  3x / week    PT Duration  8 weeks    PT Treatment/Interventions  Electrical Stimulation;Cryotherapy;Moist Heat;Iontophoresis 4mg /ml Dexamethasone;Neuromuscular re-education;Therapeutic exercise;Therapeutic activities;Passive range of motion;Manual techniques;Taping;Vasopneumatic Device;ADLs/Self Care Home Management;Ultrasound    PT Next Visit Plan  continue with strengthening and scapular mobility, begin work related strengthening, rows, box lifts, overhead lifts    PT Home Exercise Plan  Planks, serratus punches with 1-2# weight in clock pattern.     Consulted and Agree with Plan of Care  Patient       Patient will benefit from skilled therapeutic intervention in order to improve the following deficits and impairments:  Impaired UE functional use, Decreased strength, Decreased range of motion, Decreased activity tolerance, Decreased endurance  Visit Diagnosis: Stiffness of right shoulder, not elsewhere classified     Problem List Patient Active Problem List   Diagnosis Date Noted  . Chlamydia infection 01/03/2017  . Abnormal uterine bleeding (AUB) 12/31/2016  . IUD (intrauterine device) in place 12/31/2016  . LLQ pain 12/31/2016  . Encounter for IUD insertion 08/31/2016    Bethany FullerKelsey P Arneisha Kincannon, PTA 04/03/2018, 12:01 PM  [facility] [address] [place], [place], [address] Phone: [phone]   Fax:  [phone]  Name: Bethany Navarro MRN: [id number] Date of Birth: 01/13/1996

## 2018-04-05 ENCOUNTER — Encounter: Payer: Self-pay | Admitting: Physical Therapy

## 2018-04-07 ENCOUNTER — Encounter: Payer: Self-pay | Admitting: Physical Therapy

## 2018-04-10 ENCOUNTER — Encounter: Payer: Self-pay | Admitting: Physical Therapy

## 2018-04-10 ENCOUNTER — Ambulatory Visit: Payer: Worker's Compensation | Admitting: Physical Therapy

## 2018-04-10 DIAGNOSIS — M25611 Stiffness of right shoulder, not elsewhere classified: Secondary | ICD-10-CM | POA: Diagnosis not present

## 2018-04-10 NOTE — Therapy (Signed)
[facility] [address] [place], [place], [address] Phone: [phone]   Fax:  [phone]  Physical Therapy Treatment  Patient Details  Name: Bethany Navarro MRN: [id number] Date of Birth: 05/03/1996 Referring Provider: Valora PiccoloJulie Ann Freischlag   Encounter Date: 04/10/2018  PT End of Session - 04/10/18 1127    Visit Number  21    Number of Visits  48    Date for PT Re-Evaluation  05/11/18    Authorization - Visit Number  21    Authorization - Number of Visits  48    PT Start Time  1116    PT Stop Time  1200    PT Time Calculation (min)  44 min    Activity Tolerance  Patient tolerated treatment well    Behavior During Therapy  [facility] for tasks assessed/performed       Past Medical History:  Diagnosis Date  . Back pain   . Chlamydia infection 01/03/2017  . Neck pain   . Shoulder pain, right    frozen shoulder    Past Surgical History:  Procedure Laterality Date  . NO PAST SURGERIES      There were no vitals filed for this visit.  Subjective Assessment - 04/10/18 1124    Subjective  Patient reported shes has missed last couple of visits due to oversleeping and forgetting appointment. Patient overall reported no pain.     Limitations  Lifting;House hold activities    Diagnostic tests  MRI, CT, Myelogram    Patient Stated Goals  Get back to normal activities and work.    Currently in Pain?  No/denies         [facility] - 04/10/18 0001      Assessment   Medical Diagnosis  Right thoracic outlet syndrome surgery    Onset Date/Surgical Date  11/02/17    Hand Dominance  Right    Next MD Visit  Sept 16, 2019                   [facility] - 04/10/18 0001      Exercises   Exercises  Shoulder      Shoulder Exercises: Sidelying   Other Sidelying Exercises  R side planks 5x 10" hold      Shoulder Exercises: Standing   Protraction  Strengthening;Both;Limitations     Protraction Limitations  Pink XTS 4x10    Row  Strengthening;Both;Limitations    Row Limitations  Orange XTS 4x10 reps    Diagonals Limitations  Chop wood; 4x10 reps with Orange XTS    Other Standing Exercises  R shoulder flex, scap, abd 2# x10 reps      Shoulder Exercises: ROM/Strengthening   UBE (Upper Arm Bike)  30 RPM x8 min      Shoulder Exercises: Body Blade   Flexion  30 seconds;3 reps   at 90 degreees   ABduction  30 seconds;3 reps   at 90 degrees   External Rotation  30 seconds;3 reps   at 0 degrees of flexion     Manual Therapy   Soft tissue mobilization  IASTM and STW to anterior shoulder to reduce adhesions and improve ROM                  PT Long Term Goals - 03/27/18 1228      PT LONG TERM GOAL #1   Title  Patient will be independent with HEP    Time  8  Period  Weeks    Status  On-going      PT LONG TERM GOAL #2   Title  Patient will improve right grip strength to 60 lb of force or greater for improved function.    Period  Weeks    Status  On-going      PT LONG TERM GOAL #3   Title  Patient will improve right shoulder strength to 5/5 for stability during functional and work activities, when medically cleared to return to work.    Time  6    Period  Weeks      PT LONG TERM GOAL #4   Title  Patient will improve right shoulder abduction AROM to WNL to improve ability to perform ADLs and functional tasks.    Time  6    Period  Weeks    Status  On-going            Plan - 04/10/18 1204    Clinical Impression Statement  Patient was able to tolerate treatment well although still noted with muscle weakness with flexion, scaption and abduction exercises. Patient continues to have intermittent "popping" in bilateral shoulders with protraction but reports popping is tolerable and not painful. Patient noted with increased adhesions along axilla and pectoralis muscles IASTM was performed; patient noted with good response. Patient emphasized  importance of attending therapy to improve strength for return to work.     Clinical Presentation  Stable    Clinical Decision Making  Low    Rehab Potential  Excellent    PT Frequency  3x / week    PT Duration  8 weeks    PT Treatment/Interventions  Electrical Stimulation;Cryotherapy;Moist Heat;Iontophoresis 4mg /ml Dexamethasone;Neuromuscular re-education;Therapeutic exercise;Therapeutic activities;Passive range of motion;Manual techniques;Taping;Vasopneumatic Device;ADLs/Self Care Home Management;Ultrasound    PT Next Visit Plan  continue with strengthening and scapular mobility, begin work related strengthening, rows, box lifts, overhead lifts    Consulted and Agree with Plan of Care  Patient       Patient will benefit from skilled therapeutic intervention in order to improve the following deficits and impairments:  Impaired UE functional use, Decreased strength, Decreased range of motion, Decreased activity tolerance, Decreased endurance  Visit Diagnosis: Stiffness of right shoulder, not elsewhere classified     Problem List Patient Active Problem List   Diagnosis Date Noted  . Chlamydia infection 01/03/2017  . Abnormal uterine bleeding (AUB) 12/31/2016  . IUD (intrauterine device) in place 12/31/2016  . LLQ pain 12/31/2016  . Encounter for IUD insertion 08/31/2016   Guss Bunde, PT, DPT 04/10/2018, 12:17 PM  [facility] [address] [place], [place], [address] Phone: [phone]   Fax:  [phone]  Name: Bethany Navarro MRN: [id number] Date of Birth: 1996-04-14

## 2018-04-12 ENCOUNTER — Encounter: Payer: Self-pay | Admitting: Physical Therapy

## 2018-04-13 ENCOUNTER — Ambulatory Visit: Payer: Worker's Compensation | Admitting: Physical Therapy

## 2018-04-13 ENCOUNTER — Encounter: Payer: Self-pay | Admitting: Physical Therapy

## 2018-04-13 DIAGNOSIS — M25611 Stiffness of right shoulder, not elsewhere classified: Secondary | ICD-10-CM

## 2018-04-13 NOTE — Therapy (Signed)
[facility] [address] [place], [place], [address] Phone: [phone]   Fax:  [phone]  Physical Therapy Treatment  Patient Details  Name: Bethany Navarro MRN: [id number] Date of Birth: 1995-11-17 Referring Provider: Valora Piccolo Freischlag   Encounter Date: 04/13/2018  PT End of Session - 04/13/18 1125    Visit Number  22    Number of Visits  48    Date for PT Re-Evaluation  05/11/18    Authorization - Visit Number  22    Authorization - Number of Visits  48    PT Start Time  1115    PT Stop Time  1200    PT Time Calculation (min)  45 min    Activity Tolerance  Patient tolerated treatment well    Behavior During Therapy  [facility] for tasks assessed/performed       Past Medical History:  Diagnosis Date  . Back pain   . Chlamydia infection 01/03/2017  . Neck pain   . Shoulder pain, right    frozen shoulder    Past Surgical History:  Procedure Laterality Date  . NO PAST SURGERIES      There were no vitals filed for this visit.  Subjective Assessment - 04/13/18 1120    Subjective  Reports numbness and popping in R shoulder with exercise or if she lays on the R side.    Limitations  Lifting;House hold activities    Diagnostic tests  MRI, CT, Myelogram    Patient Stated Goals  Get back to normal activities and work.    Currently in Pain?  No/denies         [facility] PT Assessment - 04/13/18 0001      Assessment   Medical Diagnosis  Right thoracic outlet syndrome surgery    Onset Date/Surgical Date  11/02/17    Hand Dominance  Right    Next MD Visit  Sept 16, 2019    Prior Therapy  Yes      Precautions   Precautions  Shoulder      Restrictions   Weight Bearing Restrictions  No                   [facility] Adult PT Treatment/Exercise - 04/13/18 0001      Exercises   Exercises  Shoulder      Shoulder Exercises: Prone   Retraction  Strengthening;Right;Weights;Limitations    Retraction Weight (lbs)  3     Retraction Limitations  3x10 reps    Flexion  Strengthening;Right;20 reps;Weights    Flexion Weight (lbs)  2    Extension  Strengthening;Right;Weights;Limitations    Extension Weight (lbs)  3    Extension Limitations  3x10 reps    Horizontal ABduction 1  Strengthening;Right;Weights    Horizontal ABduction 1 Weight (lbs)  2    Horizontal ABduction 1 Limitations  2x10 reps      Shoulder Exercises: Standing   Protraction  Strengthening;Both;Limitations    Protraction Limitations  Pink XTS 4x10    Extension  Strengthening;Both;Limitations    Extension Limitations  4x10 reps in squat Orange XTS    Row  Strengthening;Both;Limitations    Row Limitations  Orange XTS 4x10 reps    Diagonals  Strengthening;Both;Limitations    Diagonals Limitations  Chop wood; 4x10 reps with [employer]    Other Standing Exercises  R shoulder flex, scap, abd 2# x10 reps      Shoulder Exercises: ROM/Strengthening   UBE (Upper Arm Bike)  30  RPM x8 min    Plank  5 reps;Other (comment)   x20 sec   Side Plank  5 reps;Other (comment)   x20 sec R side                 PT Long Term Goals - 03/27/18 1228      PT LONG TERM GOAL #1   Title  Patient will be independent with HEP    Time  8    Period  Weeks    Status  On-going      PT LONG TERM GOAL #2   Title  Patient will improve right grip strength to 60 lb of force or greater for improved function.    Period  Weeks    Status  On-going      PT LONG TERM GOAL #3   Title  Patient will improve right shoulder strength to 5/5 for stability during functional and work activities, when medically cleared to return to work.    Time  6    Period  Weeks      PT LONG TERM GOAL #4   Title  Patient will improve right shoulder abduction AROM to WNL to improve ability to perform ADLs and functional tasks.    Time  6    Period  Weeks    Status  On-going            Plan - 04/13/18 1205    Clinical Impression Statement  Patient tolerated today's  treatment well as she was able to tolerate all exercises with no reports of discomfort. Patient limited with reps of abduction and flexion resistance in prone. Good technique noted with side and front planks with good technique. Patient continues to report popping in R shoulder with exercises.     Rehab Potential  Excellent    PT Frequency  3x / week    PT Duration  8 weeks    PT Treatment/Interventions  Electrical Stimulation;Cryotherapy;Moist Heat;Iontophoresis 4mg /ml Dexamethasone;Neuromuscular re-education;Therapeutic exercise;Therapeutic activities;Passive range of motion;Manual techniques;Taping;Vasopneumatic Device;ADLs/Self Care Home Management;Ultrasound    PT Next Visit Plan  continue with strengthening and scapular mobility, begin work related strengthening, rows, box lifts, overhead lifts    PT Home Exercise Plan  Planks, serratus punches with 1-2# weight in clock pattern.     Consulted and Agree with Plan of Care  Patient       Patient will benefit from skilled therapeutic intervention in order to improve the following deficits and impairments:  Impaired UE functional use, Decreased strength, Decreased range of motion, Decreased activity tolerance, Decreased endurance  Visit Diagnosis: Stiffness of right shoulder, not elsewhere classified     Problem List Patient Active Problem List   Diagnosis Date Noted  . Chlamydia infection 01/03/2017  . Abnormal uterine bleeding (AUB) 12/31/2016  . IUD (intrauterine device) in place 12/31/2016  . LLQ pain 12/31/2016  . Encounter for IUD insertion 08/31/2016    Marvell FullerKelsey P Kennon, PTA 04/13/2018, 12:14 PM  [facility] [address] [place], [place], [address] Phone: [phone]   Fax:  [phone]  Name: Bethany Navarro MRN: [id number] Date of Birth: 1995/10/14

## 2018-04-14 ENCOUNTER — Encounter: Payer: Self-pay | Admitting: Physical Therapy

## 2018-04-18 ENCOUNTER — Ambulatory Visit: Payer: Worker's Compensation | Attending: Vascular Surgery | Admitting: Physical Therapy

## 2018-04-18 ENCOUNTER — Encounter: Payer: Self-pay | Admitting: Physical Therapy

## 2018-04-18 DIAGNOSIS — M25611 Stiffness of right shoulder, not elsewhere classified: Secondary | ICD-10-CM | POA: Diagnosis present

## 2018-04-18 NOTE — Therapy (Signed)
[facility] [address] [place], [place], [address] Phone: [phone]   Fax:  [phone]  Physical Therapy Treatment  Patient Details  Name: Bethany Navarro MRN: [id number] Date of Birth: March 08, 1996 Referring Provider: Valora Piccolo Freischlag   Encounter Date: 04/18/2018  PT End of Session - 04/18/18 1130    Visit Number  23    Number of Visits  48    Date for PT Re-Evaluation  05/11/18    Authorization - Visit Number  23    Authorization - Number of Visits  48    PT Start Time  1116    PT Stop Time  1204    PT Time Calculation (min)  48 min    Activity Tolerance  Patient tolerated treatment well    Behavior During Therapy  [facility] for tasks assessed/performed       Past Medical History:  Diagnosis Date  . Back pain   . Chlamydia infection 01/03/2017  . Neck pain   . Shoulder pain, right    frozen shoulder    Past Surgical History:  Procedure Laterality Date  . NO PAST SURGERIES      There were no vitals filed for this visit.  Subjective Assessment - 04/18/18 1127    Subjective  Patient reports feeling a little tight today and has been feeling a sensation of burning under axilla over the past few weeks.     Limitations  Lifting;House hold activities    Diagnostic tests  MRI, CT, Myelogram    Patient Stated Goals  Get back to normal activities and work.    Currently in Pain?  No/denies    Pain Score  6    no pain, 6/10 in terms of tightness   Pain Orientation  Right    Pain Descriptors / Indicators  Tightness    Pain Type  Surgical pain    Pain Onset  More than a month ago    Pain Frequency  Intermittent         [facility] PT Assessment - 04/18/18 0001      Assessment   Medical Diagnosis  Right thoracic outlet syndrome surgery    Onset Date/Surgical Date  11/02/17    Hand Dominance  Right    Next MD Visit  Sept 16, 2019      Precautions   Precautions  Shoulder                   [facility] Adult PT  Treatment/Exercise - 04/18/18 0001      Exercises   Exercises  Shoulder      Shoulder Exercises: Prone   Retraction  Strengthening;Right;Weights;Limitations    Retraction Weight (lbs)  3    Retraction Limitations  2x10    Extension  Strengthening;Right;Weights;Limitations    Extension Weight (lbs)  3    Extension Limitations  2x10    Horizontal ABduction 1  Strengthening;Right;Weights    Horizontal ABduction 1 Weight (lbs)  2    Horizontal ABduction 1 Limitations  2x10 reps    Other Prone Exercises  Y's 2x10 2#      Shoulder Exercises: ROM/Strengthening   UBE (Upper Arm Bike)  30 RPM x8 min    Side Plank  5 reps;Other (comment)   until fatigue ~25 seconds     Manual Therapy   Manual Therapy  Soft tissue mobilization    Soft tissue mobilization  IASTM and STW to axilla and lats to reduce adhesions and improve ROM  PT Long Term Goals - 03/27/18 1228      PT LONG TERM GOAL #1   Title  Patient will be independent with HEP    Time  8    Period  Weeks    Status  On-going      PT LONG TERM GOAL #2   Title  Patient will improve right grip strength to 60 lb of force or greater for improved function.    Period  Weeks    Status  On-going      PT LONG TERM GOAL #3   Title  Patient will improve right shoulder strength to 5/5 for stability during functional and work activities, when medically cleared to return to work.    Time  6    Period  Weeks      PT LONG TERM GOAL #4   Title  Patient will improve right shoulder abduction AROM to WNL to improve ability to perform ADLs and functional tasks.    Time  6    Period  Weeks    Status  On-going            Plan - 04/18/18 1208    Clinical Impression Statement  Patient was able to tolerate treatment well. Patient continues to experience "popping" in R shoulder with various exercises and noted with a "pinch" in area of [facility] joint with side planks. Patient continues to have increased adhesions in axilla  and along latissimus dorsi. Patient was able to abduct shoulder with less "tightness" at end of session. Patient instructed to continue strengthening exercises at home to build up endurance. Patient reported understanding and agreement.     Clinical Presentation  Stable    Clinical Decision Making  Low    Rehab Potential  Excellent    PT Frequency  3x / week    PT Duration  8 weeks    PT Treatment/Interventions  Electrical Stimulation;Cryotherapy;Moist Heat;Iontophoresis 4mg /ml Dexamethasone;Neuromuscular re-education;Therapeutic exercise;Therapeutic activities;Passive range of motion;Manual techniques;Taping;Vasopneumatic Device;ADLs/Self Care Home Management;Ultrasound    PT Next Visit Plan  continue with strengthening and scapular mobility, begin work related strengthening, rows, box lifts, overhead lifts    Consulted and Agree with Plan of Care  Patient       Patient will benefit from skilled therapeutic intervention in order to improve the following deficits and impairments:  Impaired UE functional use, Decreased strength, Decreased range of motion, Decreased activity tolerance, Decreased endurance  Visit Diagnosis: Stiffness of right shoulder, not elsewhere classified     Problem List Patient Active Problem List   Diagnosis Date Noted  . Chlamydia infection 01/03/2017  . Abnormal uterine bleeding (AUB) 12/31/2016  . IUD (intrauterine device) in place 12/31/2016  . LLQ pain 12/31/2016  . Encounter for IUD insertion 08/31/2016   Guss Bunde, PT, DPT 04/18/2018, 12:25 PM  [facility] [address] [place], [place], [address] Phone: [phone]   Fax:  [phone]  Name: Bethany Navarro MRN: [id number] Date of Birth: [birth year]

## 2018-04-20 ENCOUNTER — Encounter: Payer: Self-pay | Admitting: Physical Therapy

## 2018-04-20 ENCOUNTER — Ambulatory Visit: Payer: Worker's Compensation | Admitting: Physical Therapy

## 2018-04-20 DIAGNOSIS — M25611 Stiffness of right shoulder, not elsewhere classified: Secondary | ICD-10-CM | POA: Diagnosis not present

## 2018-04-20 NOTE — Therapy (Signed)
[facility] [address] [place], [place], [address] Phone: [phone]   Fax:  [phone]  Physical Therapy Treatment  Patient Details  Name: Bethany Navarro MRN: [id number] Date of Birth: Dec 14, 1995 Referring Provider: Valora Piccolo Freischlag   Encounter Date: 04/20/2018  PT End of Session - 04/20/18 1213    Visit Number  24    Number of Visits  48    Date for PT Re-Evaluation  05/11/18    Authorization - Visit Number  24    Authorization - Number of Visits  48    PT Start Time  1115    PT Stop Time  1205    PT Time Calculation (min)  50 min    Activity Tolerance  Patient tolerated treatment well    Behavior During Therapy  [facility] for tasks assessed/performed       Past Medical History:  Diagnosis Date  . Back pain   . Chlamydia infection 01/03/2017  . Neck pain   . Shoulder pain, right    frozen shoulder    Past Surgical History:  Procedure Laterality Date  . NO PAST SURGERIES      There were no vitals filed for this visit.  Subjective Assessment - 04/20/18 1211    Subjective  Pt reports feeling tightness in her R shoulder shoulder today. Pt did report mild bruising after her last treatment.     Limitations  Lifting;House hold activities    Diagnostic tests  MRI, CT, Myelogram    Patient Stated Goals  Get back to normal activities and work.    Currently in Pain?  No/denies         [facility] PT Assessment - 04/20/18 0001      Assessment   Medical Diagnosis  Right thoracic outlet syndrome surgery    Onset Date/Surgical Date  11/02/17    Hand Dominance  Right    Next MD Visit  Sept 16, 2019                   [facility] Adult PT Treatment/Exercise - 04/20/18 0001      Exercises   Exercises  Shoulder      Shoulder Exercises: Prone   Retraction  Strengthening;Right;Weights;Limitations    Retraction Weight (lbs)  3    Retraction Limitations  2x10    Flexion Weight (lbs)  2    Extension   Strengthening;Right;Weights;Limitations    Extension Weight (lbs)  3    Extension Limitations  2x10    Horizontal ABduction 1  Strengthening;Right;Weights    Horizontal ABduction 1 Weight (lbs)  2    Horizontal ABduction 1 Limitations  2x10 reps    Other Prone Exercises  Y's 2x10 2#      Shoulder Exercises: Standing   Diagonals Limitations  wood chops     Other Standing Exercises  kettle bell squats with 10 lbs x 15 reps      Shoulder Exercises: ROM/Strengthening   UBE (Upper Arm Bike)  30 RPM x8 min      Modalities   Modalities  Moist Heat      Moist Heat Therapy   Number Minutes Moist Heat  8 Minutes    Moist Heat Location  Other (comment)   right axilla     Manual Therapy   Manual Therapy  Soft tissue mobilization    Soft tissue mobilization  IASTM and STW to axilla and lats to reduce adhesions and improve ROM  PT Long Term Goals - 04/20/18 1214      PT LONG TERM GOAL #1   Title  Patient will be independent with HEP    Time  8    Period  Weeks    Status  On-going      PT LONG TERM GOAL #2   Title  Patient will improve right grip strength to 60 lb of force or greater for improved function.    Period  Weeks    Status  On-going      PT LONG TERM GOAL #3   Title  Patient will improve right shoulder strength to 5/5 for stability during functional and work activities, when medically cleared to return to work.    Time  6    Status  On-going      PT LONG TERM GOAL #4   Title  Patient will improve right shoulder abduction AROM to WNL to improve ability to perform ADLs and functional tasks.    Period  Weeks    Status  On-going            Plan - 04/20/18 1218    Clinical Impression Statement  Pt tolerating all treatment exercises well progressing toward more strength training. Pt reporting no pain during session. IASTM performed to decrease adhessions in axilla. Pt reporting less tightness at end of session.     Rehab Potential   Excellent    PT Frequency  3x / week    PT Duration  8 weeks    PT Treatment/Interventions  Electrical Stimulation;Cryotherapy;Moist Heat;Iontophoresis 4mg /ml Dexamethasone;Neuromuscular re-education;Therapeutic exercise;Therapeutic activities;Passive range of motion;Manual techniques;Taping;Vasopneumatic Device;ADLs/Self Care Home Management;Ultrasound    PT Next Visit Plan  continue with strengthening and scapular mobility, begin work related strengthening, rows, box lifts, overhead lifts    PT Home Exercise Plan  Planks, serratus punches with 1-2# weight in clock pattern. Ball walks, wall stretches    Consulted and Agree with Plan of Care  Patient       Patient will benefit from skilled therapeutic intervention in order to improve the following deficits and impairments:  Impaired UE functional use, Decreased strength, Decreased range of motion, Decreased activity tolerance, Decreased endurance  Visit Diagnosis: Stiffness of right shoulder, not elsewhere classified     Problem List Patient Active Problem List   Diagnosis Date Noted  . Chlamydia infection 01/03/2017  . Abnormal uterine bleeding (AUB) 12/31/2016  . IUD (intrauterine device) in place 12/31/2016  . LLQ pain 12/31/2016  . Encounter for IUD insertion 08/31/2016    Sharmon Leyden , MPT 04/20/2018, 12:20 PM  [facility] [address] [place], [place], [address] Phone: [phone]   Fax:  [phone]  Name: Bethany Navarro MRN: [id number] Date of Birth: 01-24-1996

## 2018-04-25 ENCOUNTER — Encounter: Payer: Self-pay | Admitting: Physical Therapy

## 2018-04-25 ENCOUNTER — Ambulatory Visit: Payer: Worker's Compensation | Admitting: Physical Therapy

## 2018-04-25 DIAGNOSIS — M25611 Stiffness of right shoulder, not elsewhere classified: Secondary | ICD-10-CM | POA: Diagnosis not present

## 2018-04-25 NOTE — Therapy (Signed)
[facility] [address] [place], [place], [address] Phone: [phone]   Fax:  [phone]  Physical Therapy Treatment  Patient Details  Name: Bethany Navarro Ward MRN: [id number] Date of Birth: September 20, 1995 Referring Provider: Valora Piccolo Freischlag   Encounter Date: 04/25/2018  PT End of Session - 04/25/18 1154    Visit Number  25    Number of Visits  48    Date for PT Re-Evaluation  05/11/18    Authorization - Visit Number  25    Authorization - Number of Visits  48    PT Start Time  1115    PT Stop Time  1205    PT Time Calculation (min)  50 min    Activity Tolerance  Patient tolerated treatment well    Behavior During Therapy  [facility] for tasks assessed/performed       Past Medical History:  Diagnosis Date  . Back pain   . Chlamydia infection 01/03/2017  . Neck pain   . Shoulder pain, right    frozen shoulder    Past Surgical History:  Procedure Laterality Date  . NO PAST SURGERIES      There were no vitals filed for this visit.  Subjective Assessment - 04/25/18 1152    Subjective  Patient reports feeling "fine" but hasn't been using the shoulder much since she was in class    Limitations  Lifting;House hold activities    Diagnostic tests  MRI, CT, Myelogram    Patient Stated Goals  Get back to normal activities and work.    Currently in Pain?  No/denies         [facility] PT Assessment - 04/25/18 0001      Assessment   Medical Diagnosis  Right thoracic outlet syndrome surgery    Onset Date/Surgical Date  11/02/17    Hand Dominance  Right    Next MD Visit  Sept 16, 2019                   [facility] Adult PT Treatment/Exercise - 04/25/18 0001      Exercises   Exercises  Shoulder      Shoulder Exercises: Prone   Other Prone Exercises  Y's 3x10 2#      Shoulder Exercises: Standing   Horizontal ABduction  Strengthening;Both;Theraband;Limitations    Horizontal ABduction Limitations  Green XTS 2x10    Diagonals Limitations  wood chops with 10# medicine ball    Other Standing Exercises  Scapular walks ups with red theraband x10 followed by 10# medicine ball overhead tosses with squat x10    Other Standing Exercises  kettle bell squats with 10 lbs 2x10 reps, box overhead lifts x20      Shoulder Exercises: ROM/Strengthening   UBE (Upper Arm Bike)  30 RPM x8 min    Plank  3 reps   until fatigue on red Physioball   Other ROM/Strengthening Exercises  farmer carry 10# down carpeted hall way x4                  PT Long Term Goals - 04/20/18 1214      PT LONG TERM GOAL #1   Title  Patient will be independent with HEP    Time  8    Period  Weeks    Status  On-going      PT LONG TERM GOAL #2   Title  Patient will improve right grip strength to 60 lb of force or greater for  improved function.    Period  Weeks    Status  On-going      PT LONG TERM GOAL #3   Title  Patient will improve right shoulder strength to 5/5 for stability during functional and work activities, when medically cleared to return to work.    Time  6    Status  On-going      PT LONG TERM GOAL #4   Title  Patient will improve right shoulder abduction AROM to WNL to improve ability to perform ADLs and functional tasks.    Period  Weeks    Status  On-going            Plan - 04/25/18 1208    Clinical Impression Statement  Patient was able to tolerate progression of strengthening exercises with just reports of fatigue. Patient required intermittent cuing for proper form and was able to carryover cuing for remainder of exercise. Patient to see MD on Monday 05/01/18.    Clinical Presentation  Stable    Clinical Decision Making  Low    Rehab Potential  Excellent    PT Frequency  3x / week    PT Duration  8 weeks    PT Treatment/Interventions  Electrical Stimulation;Cryotherapy;Moist Heat;Iontophoresis 4mg /ml Dexamethasone;Neuromuscular re-education;Therapeutic exercise;Therapeutic activities;Passive range  of motion;Manual techniques;Taping;Vasopneumatic Device;ADLs/Self Care Home Management;Ultrasound    PT Next Visit Plan  Assess goals and route to MD. Continue with strengthening and scapular mobility, begin work related strengthening, rows, box lifts, overhead lifts    Consulted and Agree with Plan of Care  Patient       Patient will benefit from skilled therapeutic intervention in order to improve the following deficits and impairments:  Impaired UE functional use, Decreased strength, Decreased range of motion, Decreased activity tolerance, Decreased endurance  Visit Diagnosis: Stiffness of right shoulder, not elsewhere classified     Problem List Patient Active Problem List   Diagnosis Date Noted  . Chlamydia infection 01/03/2017  . Abnormal uterine bleeding (AUB) 12/31/2016  . IUD (intrauterine device) in place 12/31/2016  . LLQ pain 12/31/2016  . Encounter for IUD insertion 08/31/2016   Bethany Navarro, PT, DPT 04/25/2018, 12:17 PM  [facility] [address] [place], [place], [address] Phone: [phone]   Fax:  [phone]  Name: Bethany Navarro Ward MRN: [id number] Date of Birth: 26-Jul-1996

## 2018-04-27 ENCOUNTER — Encounter: Payer: Self-pay | Admitting: Physical Therapy

## 2018-04-27 ENCOUNTER — Ambulatory Visit: Payer: Worker's Compensation | Admitting: Physical Therapy

## 2018-04-27 DIAGNOSIS — M25611 Stiffness of right shoulder, not elsewhere classified: Secondary | ICD-10-CM | POA: Diagnosis not present

## 2018-04-27 NOTE — Therapy (Signed)
[facility] [place], [place], [address] Phone: [phone]   Fax:  [phone]  Physical Therapy Treatment  Patient Details  Name: Bethany Navarro MRN: [id number] Date of Birth: 02-22-1996 Referring Provider: Nancie Neas Freischlag   Encounter Date: 04/27/2018  PT End of Session - 04/27/18 1124    Visit Number  26    Number of Visits  48    Date for PT Re-Evaluation  05/11/18    Authorization - Visit Number  78    Authorization - Number of Visits  48    PT Start Time  1120    PT Stop Time  1206    PT Time Calculation (min)  46 min    Activity Tolerance  Patient tolerated treatment well    Behavior During Therapy  [facility] for tasks assessed/performed       Past Medical History:  Diagnosis Date  . Back pain   . Chlamydia infection 01/03/2017  . Neck pain   . Shoulder pain, right    frozen shoulder    Past Surgical History:  Procedure Laterality Date  . NO PAST SURGERIES      There were no vitals filed for this visit.  Subjective Assessment - 04/27/18 1123    Subjective  Reports tightness in shoulder but no pain as she has been having a lot of car issues recently.    Limitations  Lifting;House hold activities    Diagnostic tests  MRI, CT, Myelogram    Patient Stated Goals  Get back to normal activities and work.    Currently in Pain?  No/denies         [facility] PT Assessment - 04/27/18 0001      Assessment   Medical Diagnosis  Right thoracic outlet syndrome surgery    Onset Date/Surgical Date  11/02/17    Hand Dominance  Right    Next MD Visit  05/01/2018    Prior Therapy  Yes      Precautions   Precautions  Shoulder      Restrictions   Weight Bearing Restrictions  No      ROM / Strength   AROM / PROM / Strength  AROM;Strength      AROM   Overall AROM   Within functional limits for tasks performed    AROM Assessment Site  Shoulder    Right/Left Shoulder  Right    Right Shoulder ABduction  180  Degrees      Strength   Overall Strength  Within functional limits for tasks performed    Strength Assessment Site  Hand;Shoulder    Right/Left Shoulder  Right    Right Shoulder Flexion  4+/5    Right Shoulder Extension  4+/5    Right Shoulder ABduction  4+/5    Right Shoulder Internal Rotation  5/5    Right Shoulder External Rotation  4+/5    Right/Left hand  Right;Left    Right Hand Grip (lbs)  50    Left Hand Grip (lbs)  45                   [facility] Adult PT Treatment/Exercise - 04/27/18 0001      Shoulder Exercises: Prone   Other Prone Exercises  Prone over ball 2# WITTY x10 reps      Shoulder Exercises: Standing   Diagonals Limitations  wood chops with 10# medicine ball x20 rpes   reported popping in sternum with R D2   Other  Standing Exercises  kettle bell squats with 10 lbs 2x20 reps, box overhead lifts 20# x20      Shoulder Exercises: ROM/Strengthening   UBE (Upper Arm Bike)  30 RPM x8 min    Plank  3 reps;30 seconds   over ball     Manual Therapy   Manual Therapy  Soft tissue mobilization    Soft tissue mobilization  STW to R teres, axilla, latissimus to reduce tightness per patient request                  PT Long Term Goals - 04/27/18 1205      PT LONG TERM GOAL #1   Title  Patient will be independent with HEP    Time  8    Period  Weeks    Status  Achieved      PT LONG TERM GOAL #2   Title  Patient will improve right grip strength to 60 lb of force or greater for improved function.    Period  Weeks    Status  Not Met   R grip 50 lb, L grip 45 lb 04/27/2018     PT LONG TERM GOAL #3   Title  Patient will improve right shoulder strength to 5/5 for stability during functional and work activities, when medically cleared to return to work.    Time  6    Status  Partially Met      PT LONG TERM GOAL #4   Title  Patient will improve right shoulder abduction AROM to WNL to improve ability to perform ADLs and functional tasks.    Period   Weeks    Status  Achieved            Plan - 04/27/18 1209    Clinical Impression Statement  Patient tolerated today's treatment well only reporting tightness upon arrival. Patient able to be progressed in clinic with resistance as well as complexity well with greatest complaint of fatigue. More work simulation exercises completed as well in squat, plank or core and LE strengthening. Patient continues to report numbness along inner upper arm (with ADLs) with moderate tension in R teres and latissimus region today. Patient able to achieve all goals except for R grip strength although R grip strength has greatly improved since evaluation.     Rehab Potential  Excellent    PT Frequency  3x / week    PT Duration  8 weeks    PT Treatment/Interventions  Electrical Stimulation;Cryotherapy;Moist Heat;Iontophoresis '4mg'$ /ml Dexamethasone;Neuromuscular re-education;Therapeutic exercise;Therapeutic activities;Passive range of motion;Manual techniques;Taping;Vasopneumatic Device;ADLs/Self Care Home Management;Ultrasound    PT Next Visit Plan  Continue per MD discretion.    PT Home Exercise Plan  Planks, serratus punches with 1-2# weight in clock pattern. Ball walks, wall stretches    Consulted and Agree with Plan of Care  Patient       Patient will benefit from skilled therapeutic intervention in order to improve the following deficits and impairments:  Impaired UE functional use, Decreased strength, Decreased range of motion, Decreased activity tolerance, Decreased endurance  Visit Diagnosis: Stiffness of right shoulder, not elsewhere classified     Problem List Patient Active Problem List   Diagnosis Date Noted  . Chlamydia infection 01/03/2017  . Abnormal uterine bleeding (AUB) 12/31/2016  . IUD (intrauterine device) in place 12/31/2016  . LLQ pain 12/31/2016  . Encounter for IUD insertion 08/31/2016    Standley Brooking, PTA 04/27/18 12:16 PM   [facility] [place], [place], [address] Phone: [phone]   Fax:  [phone]  Name: Bethany Navarro MRN: [id number] Date of Birth: 1996-01-05

## 2018-05-02 ENCOUNTER — Encounter: Payer: Self-pay | Admitting: Physical Therapy

## 2018-05-02 ENCOUNTER — Ambulatory Visit: Payer: Worker's Compensation | Admitting: Physical Therapy

## 2018-05-02 DIAGNOSIS — M25611 Stiffness of right shoulder, not elsewhere classified: Secondary | ICD-10-CM

## 2018-05-02 NOTE — Therapy (Signed)
[facility], [place], [address] Phone: [phone]   Fax:  [phone]  Physical Therapy Treatment  Patient Details  Name: Bethany Navarro MRN: [id number] Date of Birth: Aug 23, 1995 Referring Provider: Nancie Neas Freischlag   Encounter Date: 05/02/2018  PT End of Session - 05/02/18 1114    Visit Number  27    Number of Visits  48    Date for PT Re-Evaluation  05/11/18    Authorization - Visit Number  45    Authorization - Number of Visits  60    PT Start Time  1115    PT Stop Time  1204    PT Time Calculation (min)  49 min    Activity Tolerance  Patient tolerated treatment well    Behavior During Therapy  [facility] for tasks assessed/performed       Past Medical History:  Diagnosis Date  . Back pain   . Chlamydia infection 01/03/2017  . Neck pain   . Shoulder pain, right    frozen shoulder    Past Surgical History:  Procedure Laterality Date  . NO PAST SURGERIES      There were no vitals filed for this visit.  Subjective Assessment - 05/02/18 1215    Subjective  Patient reported doctor's appointment went well and that she was released under the condition she completes remaining 4 visits.     Limitations  Lifting;House hold activities    Diagnostic tests  MRI, CT, Myelogram    Patient Stated Goals  Get back to normal activities and work.    Currently in Pain?  No/denies         [facility] PT Assessment - 05/02/18 0001      Assessment   Medical Diagnosis  Right thoracic outlet syndrome surgery    Onset Date/Surgical Date  11/02/17    Hand Dominance  Right    Next MD Visit  2020    Prior Therapy  Yes                   [facility] Adult PT Treatment/Exercise - 05/02/18 0001      Exercises   Exercises  Shoulder      Shoulder Exercises: Prone   Other Prone Exercises  Prone over green ball 2# WITTY x10 reps     Other Prone Exercises  bear crawls x2 minutes      Shoulder Exercises: Standing    Horizontal ABduction  Strengthening;Both;Theraband;Limitations    Horizontal ABduction Limitations  Green XTS 3x10    Diagonals Limitations  wood chops with 10# medicine ball x20 rpes    Other Standing Exercises  walking lunges down carpeted hallway with overhead 10# medicine ball press up x1    Other Standing Exercises  kettle bell squats with 10 lbs 3x10 reps, box overhead lifts 20# x20      Shoulder Exercises: ROM/Strengthening   UBE (Upper Arm Bike)  30 RPM x8 min    Plank  60 seconds;1 rep      Shoulder Exercises: Body Blade   External Rotation  60 seconds;2 reps   large body blade   Other Body Blade Exercises  D2 flexion 1 minute x2                  PT Long Term Goals - 04/27/18 1205      PT LONG TERM GOAL #1   Title  Patient will be independent with HEP    Time  8  Period  Weeks    Status  Achieved      PT LONG TERM GOAL #2   Title  Patient will improve right grip strength to 60 lb of force or greater for improved function.    Period  Weeks    Status  Not Met   R grip 50 lb, L grip 45 lb 04/27/2018     PT LONG TERM GOAL #3   Title  Patient will improve right shoulder strength to 5/5 for stability during functional and work activities, when medically cleared to return to work.    Time  6    Status  Partially Met      PT LONG TERM GOAL #4   Title  Patient will improve right shoulder abduction AROM to WNL to improve ability to perform ADLs and functional tasks.    Period  Weeks    Status  Achieved            Plan - 05/02/18 1220    Clinical Impression Statement  Patient was able to complete treatment with some reports of muscle fatigue. Patient was alble to demonstrate good form with new exercises. Patient provided with grip strengthening exercises and was instructed returning to full work load gradually. Patient reported understanding. Continue strengthening for remaining visits with emphasis on work simulation.    Clinical Presentation  Stable     Clinical Decision Making  Low    Rehab Potential  Excellent    PT Frequency  3x / week    PT Duration  8 weeks    PT Treatment/Interventions  Electrical Stimulation;Cryotherapy;Moist Heat;Iontophoresis '4mg'$ /ml Dexamethasone;Neuromuscular re-education;Therapeutic exercise;Therapeutic activities;Passive range of motion;Manual techniques;Taping;Vasopneumatic Device;ADLs/Self Care Home Management;Ultrasound    PT Next Visit Plan  Continue with work related strengthening     Consulted and Agree with Plan of Care  Patient       Patient will benefit from skilled therapeutic intervention in order to improve the following deficits and impairments:  Impaired UE functional use, Decreased strength, Decreased range of motion, Decreased activity tolerance, Decreased endurance  Visit Diagnosis: Stiffness of right shoulder, not elsewhere classified     Problem List Patient Active Problem List   Diagnosis Date Noted  . Chlamydia infection 01/03/2017  . Abnormal uterine bleeding (AUB) 12/31/2016  . IUD (intrauterine device) in place 12/31/2016  . LLQ pain 12/31/2016  . Encounter for IUD insertion 08/31/2016   Gabriela Eves, PT, DPT 05/02/2018, 12:23 PM  [facility] [place], [place], [address] Phone: [phone]   Fax:  [phone]  Name: Bethany Navarro MRN: [id number] Date of Birth: Feb 27, 1996

## 2018-05-04 ENCOUNTER — Encounter: Payer: Self-pay | Admitting: Physical Therapy

## 2018-05-04 ENCOUNTER — Ambulatory Visit: Payer: Worker's Compensation | Admitting: Physical Therapy

## 2018-05-04 DIAGNOSIS — M25611 Stiffness of right shoulder, not elsewhere classified: Secondary | ICD-10-CM | POA: Diagnosis not present

## 2018-05-04 NOTE — Therapy (Signed)
[facility] [place], [place], [address] Phone: [phone]   Fax:  [phone]  Physical Therapy Treatment  Patient Details  Name: Bethany Navarro MRN: [id number] Date of Birth: November 30, 1995 Referring Provider: Nancie Neas Freischlag   Encounter Date: 05/04/2018  PT End of Session - 05/04/18 1134    Visit Number  28    Number of Visits  48    Date for PT Re-Evaluation  05/11/18    Authorization - Visit Number  28    Authorization - Number of Visits  71    PT Start Time  7858    PT Stop Time  1202    PT Time Calculation (min)  46 min    Activity Tolerance  Patient tolerated treatment well    Behavior During Therapy  [facility] for tasks assessed/performed       Past Medical History:  Diagnosis Date  . Back pain   . Chlamydia infection 01/03/2017  . Neck pain   . Shoulder pain, right    frozen shoulder    Past Surgical History:  Procedure Laterality Date  . NO PAST SURGERIES      There were no vitals filed for this visit.  Subjective Assessment - 05/04/18 1126    Subjective  Reports her shoulder feels "good."    Limitations  Lifting;House hold activities    Diagnostic tests  MRI, CT, Myelogram    Patient Stated Goals  Get back to normal activities and work.    Currently in Pain?  No/denies         [facility] PT Assessment - 05/04/18 0001      Assessment   Medical Diagnosis  Right thoracic outlet syndrome surgery    Onset Date/Surgical Date  11/02/17    Hand Dominance  Right    Next MD Visit  10/2018    Prior Therapy  Yes      Precautions   Precautions  Shoulder      Restrictions   Weight Bearing Restrictions  No                   [facility] Adult PT Treatment/Exercise - 05/04/18 0001      Shoulder Exercises: Prone   Other Prone Exercises  Prone over green ball 2# WITTY x10 reps     Other Prone Exercises  bear crawls x2 minutes      Shoulder Exercises: Standing   Horizontal ABduction   Strengthening;Both;Theraband;Limitations    Theraband Level (Shoulder Horizontal ABduction)  Level 3 (Green)    Diagonals Limitations  B wood chop pink XTS 2x20 reps    Other Standing Exercises  UE slider in squat 2x2 min, walking lunges with 10# OH x1 RT    Other Standing Exercises  kettle bell squats with 10 lbs 2x2 reps      Shoulder Exercises: ROM/Strengthening   UBE (Upper Arm Bike)  30 RPM x8 min    Plank  3 reps;Other (comment)   20 sec over ball                 PT Long Term Goals - 04/27/18 1205      PT LONG TERM GOAL #1   Title  Patient will be independent with HEP    Time  8    Period  Weeks    Status  Achieved      PT LONG TERM GOAL #2   Title  Patient will improve right grip strength to 60 lb  of force or greater for improved function.    Period  Weeks    Status  Not Met   R grip 50 lb, L grip 45 lb 04/27/2018     PT LONG TERM GOAL #3   Title  Patient will improve right shoulder strength to 5/5 for stability during functional and work activities, when medically cleared to return to work.    Time  6    Status  Partially Met      PT LONG TERM GOAL #4   Title  Patient will improve right shoulder abduction AROM to WNL to improve ability to perform ADLs and functional tasks.    Period  Weeks    Status  Achieved            Plan - 05/04/18 1206    Clinical Impression Statement  Patient tolerated today's treatment well with no complaints of pain. Patient able to demonstrate good technique with all exercises. Patient only reporting fatigue with exercises today. More core activation activities and work related activities completed today without complaint.    Rehab Potential  Excellent    PT Frequency  3x / week    PT Duration  8 weeks    PT Treatment/Interventions  Electrical Stimulation;Cryotherapy;Moist Heat;Iontophoresis '4mg'$ /ml Dexamethasone;Neuromuscular re-education;Therapeutic exercise;Therapeutic activities;Passive range of motion;Manual  techniques;Taping;Vasopneumatic Device;ADLs/Self Care Home Management;Ultrasound    PT Next Visit Plan  Continue with work related strengthening     PT [place], serratus punches with 1-2# weight in clock pattern. Ball walks, wall stretches    Consulted and Agree with Plan of Care  Patient       Patient will benefit from skilled therapeutic intervention in order to improve the following deficits and impairments:  Impaired UE functional use, Decreased strength, Decreased range of motion, Decreased activity tolerance, Decreased endurance  Visit Diagnosis: Stiffness of right shoulder, not elsewhere classified     Problem List Patient Active Problem List   Diagnosis Date Noted  . Chlamydia infection 01/03/2017  . Abnormal uterine bleeding (AUB) 12/31/2016  . IUD (intrauterine device) in place 12/31/2016  . LLQ pain 12/31/2016  . Encounter for IUD insertion 08/31/2016    Standley Brooking, PTA 05/04/2018, 12:10 PM  [facility] [place], [place], [address] Phone: [phone]   Fax:  [phone]  Name: Bethany Navarro MRN: [id number] Date of Birth: September 15, 1995

## 2018-05-09 ENCOUNTER — Encounter: Payer: Self-pay | Admitting: Physical Therapy

## 2018-05-09 ENCOUNTER — Ambulatory Visit: Payer: Worker's Compensation | Admitting: Physical Therapy

## 2018-05-09 DIAGNOSIS — M25611 Stiffness of right shoulder, not elsewhere classified: Secondary | ICD-10-CM | POA: Diagnosis not present

## 2018-05-09 NOTE — Therapy (Signed)
[facility] [place], [place], [address] Phone: [phone]   Fax:  [phone]  Physical Therapy Treatment  Patient Details  Name: Bethany Navarro MRN: [id number] Date of Birth: [birth year] Referring Provider: Nancie Neas Freischlag   Encounter Date: 05/09/2018  PT End of Session - 05/09/18 1210    Visit Number  29    Number of Visits  48    Date for PT Re-Evaluation  05/11/18    Authorization - Visit Number  29    Authorization - Number of Visits  46    PT Start Time  1115    PT Stop Time  1207    PT Time Calculation (min)  52 min    Activity Tolerance  Patient tolerated treatment well    Behavior During Therapy  [facility] for tasks assessed/performed       Past Medical History:  Diagnosis Date  . Back pain   . Chlamydia infection 01/03/2017  . Neck pain   . Shoulder pain, right    frozen shoulder    Past Surgical History:  Procedure Laterality Date  . NO PAST SURGERIES      There were no vitals filed for this visit.  Subjective Assessment - 05/09/18 1211    Subjective  Patient reports she's feeling tired and shoulder "feels just a little tight."    Limitations  Lifting;House hold activities    Diagnostic tests  MRI, CT, Myelogram    Patient Stated Goals  Get back to normal activities and work.    Currently in Pain?  No/denies         [facility] PT Assessment - 05/09/18 0001      Assessment   Medical Diagnosis  Right thoracic outlet syndrome surgery    Onset Date/Surgical Date  11/02/17    Hand Dominance  Right    Next MD Visit  10/2018    Prior Therapy  Yes      Precautions   Precautions  Shoulder                   [facility] Adult PT Treatment/Exercise - 05/09/18 0001      Exercises   Exercises  Shoulder      Shoulder Exercises: Prone   Other Prone Exercises  Prone over green ball 2# WITTY x10 reps     Other Prone Exercises  bear crawls x2 minutes      Shoulder Exercises: Standing   Protraction  Strengthening;Both;Limitations    Protraction Limitations  Orange XTS 3x10    Diagonals Limitations  B wood chop Orange XTS two sets til fatigue    Other Standing Exercises  walking lunges with 10# OH Right hand only x1 both hands x1     Other Standing Exercises  kettle bell squats with 10 lbs right hand only x2 minutes      Shoulder Exercises: ROM/Strengthening   UBE (Upper Arm Bike)  30 RPM x8 min    Plank  Other (comment)   until fatigue x2 on red ball x1 on elbows   Side Plank  1 rep   24" pain in scapulae                 PT Long Term Goals - 04/27/18 1205      PT LONG TERM GOAL #1   Title  Patient will be independent with HEP    Time  8    Period  Weeks    Status  Achieved  PT LONG TERM GOAL #2   Title  Patient will improve right grip strength to 60 lb of force or greater for improved function.    Period  Weeks    Status  Not Met   R grip 50 lb, L grip 45 lb 04/27/2018     PT LONG TERM GOAL #3   Title  Patient will improve right shoulder strength to 5/5 for stability during functional and work activities, when medically cleared to return to work.    Time  6    Status  Partially Met      PT LONG TERM GOAL #4   Title  Patient will improve right shoulder abduction AROM to WNL to improve ability to perform ADLs and functional tasks.    Period  Weeks    Status  Achieved            Plan - 05/09/18 1154    Clinical Impression Statement  Patient was able to tolerate treatment well with good form and technique with all exercises. Patient noted with pain in the scapula during side planks so regular planks on elbows and on physioball were performed instead. Patient to be discharged next visit. Assess goals.    Clinical Presentation  Stable    Clinical Decision Making  Low    Rehab Potential  Excellent    PT Frequency  3x / week    PT Duration  8 weeks    PT Treatment/Interventions  Electrical Stimulation;Cryotherapy;Moist Heat;Iontophoresis  86m/ml Dexamethasone;Neuromuscular re-education;Therapeutic exercise;Therapeutic activities;Passive range of motion;Manual techniques;Taping;Vasopneumatic Device;ADLs/Self Care Home Management;Ultrasound    PT Next Visit Plan  Assesss goals, STW/M to lats work related exercises    Consulted and Agree with Plan of Care  Patient       Patient will benefit from skilled therapeutic intervention in order to improve the following deficits and impairments:  Impaired UE functional use, Decreased strength, Decreased range of motion, Decreased activity tolerance, Decreased endurance  Visit Diagnosis: Stiffness of right shoulder, not elsewhere classified     Problem List Patient Active Problem List   Diagnosis Date Noted  . Chlamydia infection 01/03/2017  . Abnormal uterine bleeding (AUB) 12/31/2016  . IUD (intrauterine device) in place 12/31/2016  . LLQ pain 12/31/2016  . Encounter for IUD insertion 08/31/2016   KGabriela Eves PT, DPT 05/09/2018, 2:11 PM  [facility] [place] [address]Phone: [phone]  Fax:  [phone] Name: Bethany Navarro MRN: [id number] of Birth: 707/14/1997

## 2018-05-11 ENCOUNTER — Encounter: Payer: Self-pay | Admitting: Physical Therapy

## 2018-05-11 ENCOUNTER — Ambulatory Visit: Payer: Worker's Compensation | Admitting: Physical Therapy

## 2018-05-11 DIAGNOSIS — M25611 Stiffness of right shoulder, not elsewhere classified: Secondary | ICD-10-CM

## 2018-05-11 NOTE — Therapy (Signed)
[facility] [place], [place], [address] Phone: [phone]   Fax:  [phone]  Physical Therapy Treatment  Patient Details  Name: Bethany Navarro MRN: [id number] Date of Birth: [birth year] Referring Provider (PT): Nancie Neas Freischlag   Encounter Date: 05/11/2018  PT End of Session - 05/11/18 1047    Visit Number  30    Number of Visits  48    Date for PT Re-Evaluation  05/11/18    Authorization - Visit Number  3    Authorization - Number of Visits  94    PT Start Time  2703    PT Stop Time  1116    PT Time Calculation (min)  46 min    Activity Tolerance  Patient tolerated treatment well    Behavior During Therapy  [facility] for tasks assessed/performed       Past Medical History:  Diagnosis Date  . Back pain   . Chlamydia infection 01/03/2017  . Neck pain   . Shoulder pain, right    frozen shoulder    Past Surgical History:  Procedure Laterality Date  . NO PAST SURGERIES      There were no vitals filed for this visit.  Subjective Assessment - 05/11/18 1047    Subjective  Patient reported feeling "pinching" as she slept wrong on it today.    Limitations  Lifting;House hold activities    Diagnostic tests  MRI, CT, Myelogram    Patient Stated Goals  Get back to normal activities and work.    Currently in Pain?  No/denies         [facility] PT Assessment - 05/11/18 0001      Assessment   Medical Diagnosis  Right thoracic outlet syndrome surgery    Onset Date/Surgical Date  11/02/17    Hand Dominance  Right    Next MD Visit  10/2018    Prior Therapy  Yes      Precautions   Precautions  Shoulder      AROM   Overall AROM   Within functional limits for tasks performed      Strength   Right Shoulder Flexion  4+/5    Right Shoulder Extension  4+/5    Right Shoulder ABduction  4+/5    Right Shoulder Internal Rotation  5/5    Right Shoulder External Rotation  4+/5    Right Hand Grip (lbs)  55    Left  Hand Grip (lbs)  60                   [facility] Adult PT Treatment/Exercise - 05/11/18 0001      Exercises   Exercises  Shoulder      Shoulder Exercises: Standing   Diagonals Limitations  B wood chop Orange XTS two sets til fatigue      Shoulder Exercises: ROM/Strengthening   UBE (Upper Arm Bike)  30 RPM x8 min    Other ROM/Strengthening Exercises  overhead box lifts 3x10 20#      Manual Therapy   Manual Therapy  Soft tissue mobilization    Soft tissue mobilization  IASTM to R teres, axilla, latissimus to reduce tightness per patient request                  PT Long Term Goals - 05/11/18 1050      PT LONG TERM GOAL #1   Title  Patient will be independent with HEP    Time  8  Period  Weeks    Status  Achieved      PT LONG TERM GOAL #2   Title  Patient will improve right grip strength to 60 lb of force or greater for improved function.    Time  6    Period  Weeks    Status  Partially Met   55 lbs R     PT LONG TERM GOAL #3   Title  Patient will improve right shoulder strength to 5/5 for stability during functional and work activities, when medically cleared to return to work.    Time  6    Period  Weeks    Status  Partially Met      PT LONG TERM GOAL #4   Title  Patient will improve right shoulder abduction AROM to WNL to improve ability to perform ADLs and functional tasks.    Time  6    Period  Weeks    Status  Achieved            Plan - 05/11/18 1244    Clinical Impression Statement  Patient was able to tolerate treatment well despite reports of muscle fatigue with 20# overhead box lift. Patient instructed to continue exercises to build up muscular endurace as that takes time to build. Patient reported understanding. Patient noted with muscle adhesions during IASTM; patient reported a decrease in tightness at end of session. Patient inquired information regarding taping the shoulder for stability during work. Patient provided with youtube  videos that would best stabilize the scapula. Patient also inquired information on functional capacity exam. Patient provided with telephone number and PT who performs FCEs in the [place]. Patient educated in order to have an FCE performed, the referring doctor must  write a referral for the test. Patient reported understanding. Patient to be discharged today with goals partially met.    Clinical Presentation  Stable    Clinical Decision Making  Low    Rehab Potential  Excellent    PT Frequency  1x / week    PT Duration  8 weeks    PT Treatment/Interventions  Electrical Stimulation;Cryotherapy;Moist Heat;Iontophoresis 29m/ml Dexamethasone;Neuromuscular re-education;Therapeutic exercise;Therapeutic activities;Passive range of motion;Manual techniques;Taping;Vasopneumatic Device;ADLs/Self Care Home Management;Ultrasound    PT Next Visit Plan  Discharge at this time    Consulted and Agree with Plan of Care  Patient       Patient will benefit from skilled therapeutic intervention in order to improve the following deficits and impairments:  Impaired UE functional use, Decreased strength, Decreased range of motion, Decreased activity tolerance, Decreased endurance  Visit Diagnosis: Stiffness of right shoulder, not elsewhere classified     Problem List Patient Active Problem List   Diagnosis Date Noted  . Chlamydia infection 01/03/2017  . Abnormal uterine bleeding (AUB) 12/31/2016  . IUD (intrauterine device) in place 12/31/2016  . LLQ pain 12/31/2016  . Encounter for IUD insertion 08/31/2016   PHYSICAL THERAPY DISCHARGE SUMMARY  Visits from Start of Care: 30  Current functional level related to goals / functional outcomes: See above   Remaining deficits: Right shoulder strength   Education / Equipment: HEP Plan: Patient agrees to discharge.  Patient goals were partially met. Patient is being discharged due to the physician's request.  ?????    Physician requested to  complete remaining visits until 05/11/2018 return to work date.    KGabriela Eves PT, DPT 05/11/2018, 12:57 PM  [facility] [address] [address]Phone:  [phone]   Fax:  [phone]  Name: Bethany Navarro MRN: [id number] Date of Birth: Jul 28, 1996

## 2018-10-26 ENCOUNTER — Emergency Department (HOSPITAL_COMMUNITY): Payer: BLUE CROSS/BLUE SHIELD

## 2018-10-26 ENCOUNTER — Encounter (HOSPITAL_COMMUNITY): Payer: Self-pay | Admitting: Emergency Medicine

## 2018-10-26 ENCOUNTER — Emergency Department (HOSPITAL_COMMUNITY)
Admission: EM | Admit: 2018-10-26 | Discharge: 2018-10-26 | Disposition: A | Discharge status: Home / Self Care | Payer: BLUE CROSS/BLUE SHIELD | Attending: Emergency Medicine | Admitting: Emergency Medicine

## 2018-10-26 ENCOUNTER — Other Ambulatory Visit: Payer: Self-pay

## 2018-10-26 DIAGNOSIS — F17200 Nicotine dependence, unspecified, uncomplicated: Secondary | ICD-10-CM | POA: Diagnosis not present

## 2018-10-26 DIAGNOSIS — R002 Palpitations: Secondary | ICD-10-CM | POA: Diagnosis not present

## 2018-10-26 DIAGNOSIS — R079 Chest pain, unspecified: Secondary | ICD-10-CM

## 2018-10-26 LAB — COMPREHENSIVE METABOLIC PANEL
ALT: 38 U/L (ref 0–44)
AST: 30 U/L (ref 15–41)
Albumin: 4.5 g/dL (ref 3.5–5.0)
Alkaline Phosphatase: 61 U/L (ref 38–126)
Anion gap: 9 (ref 5–15)
BUN: 9 mg/dL (ref 6–20)
CO2: 22 mmol/L (ref 22–32)
Calcium: 9.2 mg/dL (ref 8.9–10.3)
Chloride: 105 mmol/L (ref 98–111)
Creatinine, Ser: 0.74 mg/dL (ref 0.44–1.00)
GFR calc Af Amer: >60 mL/min (ref >60)
GFR calc non Af Amer: >60 mL/min (ref >60)
Glucose, Bld: 94 mg/dL (ref 70–99)
Potassium: 3.3 mmol/L — ABNORMAL LOW (ref 3.5–5.1)
Sodium: 136 mmol/L (ref 135–145)
Total Bilirubin: 0.5 mg/dL (ref 0.3–1.2)
Total Protein: 7.4 g/dL (ref 6.5–8.1)

## 2018-10-26 LAB — CBC WITH DIFFERENTIAL/PLATELET
Abs Immature Granulocytes: 0.05 10*3/uL (ref 0.00–0.07)
Basophils Absolute: 0.1 10*3/uL (ref 0.0–0.1)
Basophils Relative: 0 %
Eosinophils Absolute: 0.5 10*3/uL (ref 0.0–0.5)
Eosinophils Relative: 4 %
HCT: 42.5 % (ref 36.0–46.0)
Hemoglobin: 13.8 g/dL (ref 12.0–15.0)
Immature Granulocytes: 0 %
Lymphocytes Relative: 21 %
Lymphs Abs: 2.8 10*3/uL (ref 0.7–4.0)
MCH: 26.9 pg (ref 26.0–34.0)
MCHC: 32.5 g/dL (ref 30.0–36.0)
MCV: 82.8 fL (ref 80.0–100.0)
Monocytes Absolute: 1.1 10*3/uL — ABNORMAL HIGH (ref 0.1–1.0)
Monocytes Relative: 8 %
Neutro Abs: 9.1 10*3/uL — ABNORMAL HIGH (ref 1.7–7.7)
Neutrophils Relative %: 67 %
Platelets: 348 10*3/uL (ref 150–400)
RBC: 5.13 MIL/uL — ABNORMAL HIGH (ref 3.87–5.11)
RDW: 13.2 % (ref 11.5–15.5)
WBC: 13.6 10*3/uL — ABNORMAL HIGH (ref 4.0–10.5)
nRBC: 0 % (ref 0.0–0.2)

## 2018-10-26 LAB — TSH: TSH: 4.776 u[IU]/mL — ABNORMAL HIGH (ref 0.350–4.500)

## 2018-10-26 LAB — COOXEMETRY PANEL
Carboxyhemoglobin: 4 % — ABNORMAL HIGH (ref 0.5–1.5)
Methemoglobin: 1 % (ref 0.0–1.5)
O2 Saturation: 93.1 %
Total hemoglobin: 13.9 g/dL (ref 12.0–16.0)

## 2018-10-26 LAB — HCG, QUANTITATIVE, PREGNANCY: hCG, Beta Chain, Quant, S: 1 m[IU]/mL (ref <5)
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
## 2018-10-26 NOTE — ED Notes (Signed)
Call to lab for blood work during downtime

## 2018-11-06 ENCOUNTER — Emergency Department (HOSPITAL_COMMUNITY)
Admission: EM | Admit: 2018-11-06 | Discharge: 2018-11-06 | Disposition: A | Discharge status: Home / Self Care | Payer: BLUE CROSS/BLUE SHIELD | Attending: Emergency Medicine | Admitting: Emergency Medicine

## 2018-11-06 ENCOUNTER — Other Ambulatory Visit: Payer: Self-pay

## 2018-11-06 ENCOUNTER — Encounter (HOSPITAL_COMMUNITY): Payer: Self-pay | Admitting: Emergency Medicine

## 2018-11-06 ENCOUNTER — Emergency Department (HOSPITAL_COMMUNITY): Payer: BLUE CROSS/BLUE SHIELD

## 2018-11-06 DIAGNOSIS — S99922A Unspecified injury of left foot, initial encounter: Secondary | ICD-10-CM | POA: Diagnosis present

## 2018-11-06 DIAGNOSIS — F17210 Nicotine dependence, cigarettes, uncomplicated: Secondary | ICD-10-CM | POA: Diagnosis not present

## 2018-11-06 DIAGNOSIS — Y929 Unspecified place or not applicable: Secondary | ICD-10-CM | POA: Insufficient documentation

## 2018-11-06 DIAGNOSIS — Y999 Unspecified external cause status: Secondary | ICD-10-CM | POA: Diagnosis not present

## 2018-11-06 DIAGNOSIS — Y9301 Activity, walking, marching and hiking: Secondary | ICD-10-CM | POA: Insufficient documentation

## 2018-11-06 DIAGNOSIS — S93402A Sprain of unspecified ligament of left ankle, initial encounter: Secondary | ICD-10-CM | POA: Diagnosis not present

## 2018-11-06 DIAGNOSIS — W010XXA Fall on same level from slipping, tripping and stumbling without subsequent striking against object, initial encounter: Secondary | ICD-10-CM | POA: Insufficient documentation

## 2018-11-06 DIAGNOSIS — Z7982 Long term (current) use of aspirin: Secondary | ICD-10-CM | POA: Insufficient documentation

## 2018-11-06 NOTE — Discharge Instructions (Addendum)
You were seen in the emergency department for injury to your left ankle.  You had x-rays of your ankle and lower leg did not show any obvious fracture.  This is likely a sprain and we are treating you with an ankle brace and crutches.  You should not weight-bear on this leg for a few days and then slowly add slightly more weight until you can walk without pain not using the crutches.  Ice to the affected area on and off for 2 days.  Ibuprofen for inflammation and pain.  Follow-up with your doctor and return if any worsening symptoms.

## 2018-11-06 NOTE — ED Provider Notes (Signed)
[facility] EMERGENCY DEPARTMENT Provider Note   CSN: [id number] Arrival date & time: 11/06/18  1835    History   Chief Complaint Chief Complaint  Patient presents with  . Ankle Pain    HPI Bethany Navarro is a 23 y.o. female.  She is presenting by ambulance for evaluation of a right ankle injury.  She said she slipped while walking in wet grass and felt her foot and ankle twist turning behind her.  She said there was a loud pop.  No loss of consciousness.  She said she is unable to bear weight on this since this happened about an hour ago.  Currently when she is nonweightbearing she rates the pain as 3 out of 10 but she says it severe when she tries to put weight on it.  No numbness.  She said she is unable to move her toes.  Denies other injuries or complaints.  Last menstrual period was 2 weeks ago.     The history is provided by the patient.  Ankle Pain  Location:  Ankle Time since incident:  1 hour Injury: yes   Mechanism of injury: fall   Fall:    Fall occurred: slipped walking.   Impact surface:  Grass   Point of impact:  Unable to specify   Entrapped after fall: no   Ankle location:  L ankle Pain details:    Quality:  Throbbing   Radiates to:  L leg   Severity:  Severe   Onset quality:  Sudden   Timing:  Constant   Progression:  Unchanged Chronicity:  New Foreign body present:  No foreign bodies Prior injury to area:  No Relieved by:  Immobilization Worsened by:  Bearing weight Ineffective treatments:  None tried Associated symptoms: decreased ROM and swelling   Associated symptoms: no back pain, no fever, no neck pain and no numbness     History reviewed. No pertinent past medical history.  There are no active problems to display for this patient.   History reviewed. No pertinent surgical history.   OB History   No obstetric history on file.      Home Medications    Prior to Admission medications   Medication Sig Start Date End Date Taking?  Authorizing Provider  aspirin 81 MG chewable tablet Chew 81 mg by mouth every morning.   Yes [provider]    Family History History reviewed. No pertinent family history.  Social History Social History   Tobacco Use  . Smoking status: Current Every Day Smoker    Packs/day: 0.50  . Smokeless tobacco: Never Used  Substance Use Topics  . Alcohol use: Not Currently  . Drug use: Never     Allergies   Codeine   Review of Systems Review of Systems  Constitutional: Negative for fever.  HENT: Negative for sore throat.   Eyes: Negative for visual disturbance.  Respiratory: Negative for shortness of breath.   Cardiovascular: Negative for chest pain.  Gastrointestinal: Negative for abdominal pain.  Genitourinary: Negative for dysuria.  Musculoskeletal: Negative for back pain and neck pain.  Skin: Negative for rash.  Neurological: Negative for headaches.     Physical Exam Updated Vital Signs BP (!) 120/104   Pulse 85   Temp 98.6 F (37 C) (Oral)   Resp 18   Ht  (1.6 m)   Wt 77.1 kg   LMP 10/26/2018 (Exact Date)   SpO2 100%   BMI 30.11 kg/m   Physical Exam Vitals  signs and nursing note reviewed.  Constitutional:      General: She is not in acute distress.    Appearance: She is well-developed.  HENT:     Head: Normocephalic and atraumatic.  Eyes:     Conjunctiva/sclera: Conjunctivae normal.  Neck:     Musculoskeletal: Neck supple.  Cardiovascular:     Rate and Rhythm: Normal rate and regular rhythm.     Heart sounds: No murmur.  Pulmonary:     Effort: Pulmonary effort is normal. No respiratory distress.     Breath sounds: Normal breath sounds.  Abdominal:     Palpations: Abdomen is soft.     Tenderness: There is no abdominal tenderness.  Musculoskeletal:     Comments: She has no left hip or knee tenderness.  She is tender over the lateral malleolus and a little bit proximal to that on the lateral aspect.  No medial malleolar tenderness no  fifth metatarsal tenderness no midfoot instability.  She will minimally move her toes.  Cap refill brisk.  Mild amount of swelling over the lateral malleolus.  No obvious defect palpated in the Achilles.  No open wounds.  DP pulse intact.  Other extremities full range of motion without any pain or limitations.  Skin:    General: Skin is warm and dry.  Neurological:     General: No focal deficit present.     Mental Status: She is alert and oriented to person, place, and time.     Sensory: No sensory deficit.      ED Treatments / Results  Labs (all labs ordered are listed, but only abnormal results are displayed) Labs Reviewed - No data to display  EKG None  Radiology Dg Tibia/fibula Left  Result Date: 11/06/2018 CLINICAL DATA:  Pain following fall EXAM: LEFT TIBIA AND FIBULA - 2 VIEW COMPARISON:  None. FINDINGS: Frontal and lateral views obtained. There is no appreciable fracture or dislocation. No abnormal periosteal reaction. Joint spaces appear unremarkable. IMPRESSION: No fracture or dislocation.  No evident arthropathy. Electronically Signed   By: Bretta Bang III M.D.   On: 11/06/2018 19:11   Dg Ankle Complete Left  Result Date: 11/06/2018 CLINICAL DATA:  Pain following fall EXAM: LEFT ANKLE COMPLETE - 3+ VIEW COMPARISON:  None. FINDINGS: Frontal, oblique, and lateral views were obtained. There is no appreciable fracture or joint effusion. There is no joint space narrowing or erosion. The ankle mortise appears intact. IMPRESSION: No appreciable fracture or arthropathy. Ankle mortise appears intact. Electronically Signed   By: Bretta Bang III M.D.   On: 11/06/2018 19:12    Procedures Procedures (including critical care time)  Medications Ordered in ED Medications - No data to display   Initial Impression / Assessment and Plan / ED Course  I have reviewed the triage vital signs and the nursing notes.  Pertinent labs & imaging results that were available during my  care of the patient were reviewed by me and considered in my medical decision making (see chart for details).  Clinical Course as of Nov 06 1125  Mon Nov 06, 2018  1900 Differential diagnosis includes fracture, sprain, dislocation, Achilles disruption.   [MB]  1928 Patient's x-rays were independently viewed by me.  No obvious fracture or dislocation.  Radiology also has agreed that there is no obvious fracture or dislocation.  I reviewed the results with the patient and we will put her in a stirrup splint and crutches.   [MB]    Clinical Course User Index [  MB] Terrilee Files, MD       Final Clinical Impressions(s) / ED Diagnoses   Final diagnoses:  Sprain of left ankle, unspecified ligament, initial encounter    ED Discharge Orders    None       Terrilee Files, MD 11/07/18 1128

## 2018-11-06 NOTE — ED Triage Notes (Signed)
Pt states she slipped and fell in wet grass and left ankle went behind her.  Was unable to get up without assistance.

## 2018-11-14 ENCOUNTER — Encounter: Payer: Self-pay | Admitting: Physical Therapy

## 2018-11-16 ENCOUNTER — Telehealth (INDEPENDENT_AMBULATORY_CARE_PROVIDER_SITE_OTHER): Payer: BLUE CROSS/BLUE SHIELD | Admitting: Cardiology

## 2018-11-16 DIAGNOSIS — R002 Palpitations: Secondary | ICD-10-CM | POA: Diagnosis not present
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
## 2018-11-16 NOTE — Progress Notes (Signed)
Medication Instructions:  Your physician recommends that you continue on your current medications as directed. Please refer to the Current Medication list given to you today.  Labwork: none  Testing/Procedures: To be determined based on EKG strips emailed by patient   Follow-Up: Your physician recommends that you schedule a follow-up appointment in: to be determined    Any Other Special Instructions Will Be Listed Below (If Applicable).     If you need a refill on your cardiac medications before your next appointment, please call your pharmacy.

## 2018-11-20 ENCOUNTER — Ambulatory Visit (INDEPENDENT_AMBULATORY_CARE_PROVIDER_SITE_OTHER): Payer: BLUE CROSS/BLUE SHIELD

## 2018-11-20 DIAGNOSIS — R002 Palpitations: Secondary | ICD-10-CM | POA: Diagnosis not present

## 2018-11-21 ENCOUNTER — Other Ambulatory Visit: Payer: Self-pay

## 2018-11-21 DIAGNOSIS — R002 Palpitations: Secondary | ICD-10-CM

## 2018-12-28 ENCOUNTER — Telehealth: Payer: Self-pay

## 2018-12-28 MED ORDER — METOPROLOL TARTRATE 25 MG PO TABS: 12.5000 mg | ORAL_TABLET | Freq: Two times a day (BID) | ORAL | 3 refills | Status: DC

## 2018-12-28 NOTE — Telephone Encounter (Signed)
-----   Message from Antoine Poche, MD sent at 12/25/2018 10:45 AM EDT ----- Heart monitor overall looks good, no significant abnormal heart rhythms. If still having symptoms we could try a low dose of a medicine called metoprolol to see if that helps her feel any better. If ongoing symptoms try lopressor 12.5mg  bid. She had a negative pregancy test in 10/2018 in the ER, if she were to become pregnant would need to let us know because we would need to change he medicine. F/u with me 3-4 weeks virtual visit  JB

## 2018-12-28 NOTE — Telephone Encounter (Signed)
Spoke with pt. Informed her of test results. She voiced understanding of plan. She will try lopressor 12.5 mg bid. She will make [place] aware, if she becomes pregnant. Sent in RX to [facility].

## 2019-02-22 ENCOUNTER — Other Ambulatory Visit: Payer: Self-pay

## 2019-02-22 ENCOUNTER — Encounter: Payer: Self-pay | Admitting: Cardiology

## 2019-02-22 ENCOUNTER — Ambulatory Visit (INDEPENDENT_AMBULATORY_CARE_PROVIDER_SITE_OTHER): Payer: BC Managed Care – PPO | Admitting: Cardiology

## 2019-02-22 VITALS — BP 131/87 | HR 94 | Temp 98.7°F | Ht 63.0 in | Wt 170.0 lb

## 2019-02-22 DIAGNOSIS — R002 Palpitations: Secondary | ICD-10-CM | POA: Diagnosis not present

## 2019-02-22 MED ORDER — DILTIAZEM HCL 30 MG PO TABS: ORAL_TABLET | ORAL | 1 refills | Status: DC

## 2019-02-22 NOTE — Patient Instructions (Addendum)
Your physician recommends that you schedule a follow-up appointment in: [place] has recommended you make the following change in your medication:   STOP LOPRESSOR   START DILTIAZEM 30 MG TWICE DAILY AS NEEDED FOR PALPITATIONS   Your physician recommends that you return for lab work LIPIDS/TSH/MG/BMP  Thank you for choosing [facility]!!

## 2019-02-22 NOTE — Progress Notes (Signed)
Clinical Summary Bethany Navarro is a 23 y.o.female seen today for follow up fo the following medical problems.  1. Palpitations - ER visit 10/2018 with palpitations - WBC 13.6 Hgb 13.8 Plt 348 K 3.3 TSH 4.7 serum preg neg CXR no acute process EKG NSR. She reports she has copies of EMS strips that apparently showed the elevated hearts, EKGs by ER in epic was normal  - symptoms started about 1 year ago. Increase in frequency. Feeling of heart shaking, gets fatigued, and dizzy. Chest tightness, some SOB. Occurs at anytime. Episodes usually last 1 min up to 5 minutes. - episodes vary in frequency - apple watch episodes as fast as 190s at times.     - 11/2018 event monitor without significant arrhythmias - started on lorpessor 12.5mg  bid.  - has noticed some low heart rates at times on lopressor. Lopressor can cause significant fatigue. 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 [place] [place]    Past Medical History:  Diagnosis Date  . Back pain   . Chlamydia infection 01/03/2017  . Neck pain   . Shoulder pain, right    frozen shoulder     Allergies  Allergen Reactions  . Other     Codeine cough syrup   . Codeine Rash     Current Outpatient Medications  Medication Sig Dispense Refill  . aspirin 81 MG chewable tablet Chew 81 mg by mouth every morning.    Marland Kitchen. doxycycline (VIBRAMYCIN) 100 MG capsule Take 1 capsule (100 mg total) by mouth 2 (two) times daily. (Patient not taking: Reported on 11/16/2018) 20 capsule 0  . ibuprofen (ADVIL,MOTRIN) 200 MG tablet Take 800 mg by mouth every 4 (four) hours as needed.    . Levonorgestrel (KYLEENA) 19.5 MG IUD by Intrauterine route.    . methocarbamol (ROBAXIN) 500 MG tablet Take 1 tablet (500 mg total) by mouth 3 (three) times daily. (Patient taking differently: Take 500 mg by mouth 4 (four) times daily as needed. ) 90 tablet 2  . metoprolol tartrate (LOPRESSOR) 25 MG tablet Take 0.5 tablets (12.5 mg total) by mouth 2 (two)  times daily. 180 tablet 3   No current facility-administered medications for this visit.      Past Surgical History:  Procedure Laterality Date  . NO PAST SURGERIES       Allergies  Allergen Reactions  . Other     Codeine cough syrup   . Codeine Rash      Family History  Problem Relation Age of Onset  . Cancer Paternal Grandfather        prostate  . Hypertension Paternal Grandfather   . Hypertension Paternal Grandmother   . Hypertension Father   . Ovarian cysts Mother   . Ovarian cancer Other      Social History Bethany Navarro reports that she has been smoking cigarettes. She has a 2.50 pack-year smoking history. She has never used smokeless tobacco. [name] reports previous alcohol use.   Review of Systems CONSTITUTIONAL: No weight loss, fever, chills, weakness or fatigue.  HEENT: Eyes: No visual loss, blurred vision, double vision or yellow sclerae.No hearing loss, sneezing, congestion, runny nose or sore throat.  SKIN: No rash or itching.  CARDIOVASCULAR: per hpi RESPIRATORY: No shortness of breath, cough or sputum.  GASTROINTESTINAL: No anorexia, nausea, vomiting or diarrhea. No abdominal pain or blood.  GENITOURINARY: No burning on urination, no polyuria NEUROLOGICAL: No headache, dizziness, syncope, paralysis, ataxia, numbness or tingling  in the extremities. No change in bowel or bladder control.  MUSCULOSKELETAL: No muscle, back pain, joint pain or stiffness.  LYMPHATICS: No enlarged nodes. No history of splenectomy.  PSYCHIATRIC: No history of depression or anxiety.  ENDOCRINOLOGIC: No reports of sweating, cold or heat intolerance. No polyuria or polydipsia.  Marland Kitchen   Physical Examination Today's Vitals   02/22/19 1331  BP: 131/87  Pulse: 94  Temp: 98.7 F (37.1 C)  SpO2: 98%  Weight: 170 lb (77.1 kg)  Height: 5\' 3"  (1.6 m)   Body mass index is 30.11 kg/m.  Gen: resting comfortably, no acute distress HEENT: no scleral icterus, pupils equal round and  reactive, no palptable cervical adenopathy,  CV: RRR, no m/r/g, no jvd Resp: Clear to auscultation bilaterally GI: abdomen is soft, non-tender, non-distended, normal bowel sounds, no hepatosplenomegaly MSK: extremities are warm, no edema.  Skin: warm, no rash Neuro:  no focal deficits Psych: appropriate affect   Diagnostic Studies  11/2018 event monitor  21 day event monitor  Min HR 49, Max HR 166, Avg HR 82  Symptoms correlate with sinus rhythm and sinus tachycardia  No significant arrhythmias    Assessment and Plan   1. Palpitations - Recent benign monitor - side effects on lopressor, will try diltiazem 30mg  prn for symptoms to see if better tolerated   2. Hyperlipidemia (self reported history) - she reports being told as a teenager she had high cholesterol, has not had recent labs as no pcp - we will recheck a cholesterol  3. Elevated TSH - mildly elevated during ER visit, we will repeat lab.     Arnoldo Lenis, M.D.

## 2019-06-26 ENCOUNTER — Ambulatory Visit: Payer: BC Managed Care – PPO | Admitting: Cardiology

## 2019-06-26 NOTE — Progress Notes (Deleted)
Clinical Summary Bethany Navarro is a 23 y.o.female seen today for follow up fo the following medical problems.  1. Palpitations - ER visit 10/2018 with palpitations - WBC 13.6 Hgb 13.8 Plt 348 K 3.3 TSH 4.7 serum preg neg CXR no acute process EKG NSR. She reports she has copies of EMS strips that apparently showed the elevated hearts, EKGs by ER in epic was normal  -symptoms started about 1 year ago. Increase in frequency. Feeling of heart shaking, gets fatigued, and dizzy. Chest tightness, some SOB. Occurs at anytime. Episodes usually last 1 min up to 5 minutes. - episodes vary in frequency - apple watch episodesas fast as190s at times.     - 11/2018 event monitor without significant arrhythmias - started on lorpessor 12.5mg  bid.  - has noticed some low heart rates at times on lopressor. Lopressor can cause significant fatigue. 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 Back pain   . Chlamydia infection 01/03/2017  . Neck pain   . Shoulder pain, right    frozen shoulder     Allergies  Allergen Reactions  . Other     Codeine cough syrup   . Codeine Rash     Current Outpatient Medications  Medication Sig Dispense Refill  . diltiazem (CARDIZEM) 30 MG tablet TAKE 1 TABLET TWICE DAILY AS NEEDED 180 tablet 1  . ibuprofen (ADVIL,MOTRIN) 200 MG tablet Take 800 mg by mouth every 4 (four) hours as needed.    . Levonorgestrel (KYLEENA) 19.5 MG IUD by Intrauterine route.     No current facility-administered medications for this visit.      Past Surgical History:  Procedure Laterality Date  . NO PAST SURGERIES       Allergies  Allergen Reactions  . Other     Codeine cough syrup   . Codeine Rash      Family History  Problem Relation Age of Onset  . Cancer Paternal Grandfather        prostate  . Hypertension Paternal Grandfather   . Hypertension Paternal Grandmother   .  Hypertension Father   . Ovarian cysts Mother   . Ovarian cancer Other      Social History Bethany Navarro reports that she has been smoking cigarettes. She has a 2.50 pack-year smoking history. She has never used smokeless tobacco. Ms. Elesa Navarro reports previous alcohol use.   Review of Systems CONSTITUTIONAL: No weight loss, fever, chills, weakness or fatigue.  HEENT: Eyes: No visual loss, blurred vision, double vision or yellow sclerae.No hearing loss, sneezing, congestion, runny nose or sore throat.  SKIN: No rash or itching.  CARDIOVASCULAR:  RESPIRATORY: No shortness of breath, cough or sputum.  GASTROINTESTINAL: No anorexia, nausea, vomiting or diarrhea. No abdominal pain or blood.  GENITOURINARY: No burning on urination, no polyuria NEUROLOGICAL: No headache, dizziness, syncope, paralysis, ataxia, numbness or tingling in the extremities. No change in bowel or bladder control.  MUSCULOSKELETAL: No muscle, back pain, joint pain or stiffness.  LYMPHATICS: No enlarged nodes. No history of splenectomy.  PSYCHIATRIC: No history of depression or anxiety.  ENDOCRINOLOGIC: No reports of sweating, cold or heat intolerance. No polyuria or polydipsia.  Marland Kitchen   Physical Examination There were no vitals filed for this visit. There were no vitals filed for this visit.  Gen: resting comfortably, no acute distress HEENT: no scleral icterus, pupils equal round and reactive, no palptable  cervical adenopathy,  CV Resp: Clear to auscultation bilaterally GI: abdomen is soft, non-tender, non-distended, normal bowel sounds, no hepatosplenomegaly MSK: extremities are warm, no edema.  Skin: warm, no rash Neuro:  no focal deficits Psych: appropriate affect   Diagnostic Studies 11/2018 event monitor  21 day event monitor  Min HR 49, Max HR 166, Avg HR 82  Symptoms correlate with sinus rhythm and sinus tachycardia  No significant arrhythmias    Assessment and Plan   1. Palpitations - Recent benign  monitor - side effects on lopressor, will try diltiazem 30mg  prn for symptoms to see if better tolerated   2. Hyperlipidemia (self reported history) - she reports being told as a teenager she had high cholesterol, has not had recent labs as no pcp - we will recheck a cholesterol  3. Elevated TSH - mildly elevated during ER visit, we will repeat lab.      Arnoldo Lenis, M.D.

## 2019-07-13 ENCOUNTER — Other Ambulatory Visit: Payer: Self-pay

## 2019-07-13 ENCOUNTER — Emergency Department (INDEPENDENT_AMBULATORY_CARE_PROVIDER_SITE_OTHER)
Admission: EM | Admit: 2019-07-13 | Discharge: 2019-07-13 | Disposition: A | Discharge status: Home / Self Care | Payer: BC Managed Care – PPO | Source: Home / Self Care

## 2019-07-13 DIAGNOSIS — Z20822 Contact with and (suspected) exposure to covid-19: Secondary | ICD-10-CM

## 2019-07-13 DIAGNOSIS — Z20828 Contact with and (suspected) exposure to other viral communicable diseases: Secondary | ICD-10-CM

## 2019-07-13 HISTORY — DX: Supraventricular tachycardia: I47.1

## 2019-07-13 HISTORY — DX: Supraventricular tachycardia, unspecified: I47.10

## 2019-07-13 NOTE — ED Provider Notes (Signed)
[facility]    CSN: [id number] Arrival date & time: 07/13/19  1937      History   Chief Complaint Chief Complaint  Patient presents with  . exposure to Covid    HPI Bethany Navarro Ward is a 23 y.o. female.   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  Patient has no symptoms and neither does her coworker.     Past Medical History:  Diagnosis Date  . Back pain   . Chlamydia infection 01/03/2017  . Neck pain   . Shoulder pain, right    frozen shoulder  . SVT (supraventricular tachycardia) [facility])     Patient Active Problem List   Diagnosis Date Noted  . Chlamydia infection 01/03/2017  . Abnormal uterine bleeding (AUB) 12/31/2016  . IUD (intrauterine device) in place 12/31/2016  . LLQ pain 12/31/2016  . Encounter for IUD insertion 08/31/2016    Past Surgical History:  Procedure Laterality Date  . NO PAST SURGERIES    . SHOULDER SURGERY      OB History   No obstetric history on file.      Home Medications    Prior to Admission medications   Medication Sig Start Date End Date Taking? Authorizing Provider  diltiazem (CARDIZEM) 30 MG tablet TAKE 1 TABLET TWICE DAILY AS NEEDED 02/22/19   Antoine Poche, MD  ibuprofen (ADVIL,MOTRIN) 200 MG tablet Take 800 mg by mouth every 4 (four) hours as needed.    [provider]  Levonorgestrel (KYLEENA) 19.5 MG IUD by Intrauterine route.    [provider]    Family History Family History  Problem Relation Age of Onset  . Cancer Paternal Grandfather        prostate  . Hypertension Paternal Grandfather   . Hypertension Paternal Grandmother   . Hypertension Father   . Ovarian cysts Mother   . Ovarian cancer Other     Social History Social History   Tobacco Use  . Smoking status: Current Every Day Smoker    Packs/day: 0.50    Years: 5.00    Pack years: 2.50    Types: Cigarettes  . Smokeless  tobacco: Never Used  Substance Use Topics  . Alcohol use: Not Currently  . Drug use: Never     Allergies   Other and Codeine   Review of Systems Review of Systems  All other systems reviewed and are negative.    Physical Exam Triage Vital Signs ED Triage Vitals  Enc Vitals Group     BP 07/13/19 1950 126/85     Pulse Rate 07/13/19 1950 74     Resp 07/13/19 1950 20     Temp 07/13/19 1950 99.3 F (37.4 C)     Temp Source 07/13/19 1950 Oral     SpO2 07/13/19 1950 99 %     Weight 07/13/19 1948 170 lb (77.1 kg)     Height 07/13/19 1948 5\' 3"  (1.6 m)     Head Circumference --      Peak Flow --      Pain Score 07/13/19 1942 0     Pain Loc --      Pain Edu? --      Excl. in GC? --    No data found.  Updated Vital Signs BP 126/85 (BP Location: Right Arm)   Pulse 74   Temp 99.3 F (37.4 C) (Oral)  Resp 20   Ht 5\' 3"  (1.6 m)   Wt 77.1 kg   LMP 06/27/2019   SpO2 99%   BMI 30.11 kg/m   Physical Exam Vitals signs and nursing note reviewed.  Constitutional:      Appearance: Normal appearance.  Eyes:     Conjunctiva/sclera: Conjunctivae normal.  Neck:     Musculoskeletal: Normal range of motion and neck supple.  Cardiovascular:     Rate and Rhythm: Normal rate.  Pulmonary:     Effort: Pulmonary effort is normal.  Musculoskeletal: Normal range of motion.  Skin:    General: Skin is warm and dry.  Neurological:     General: No focal deficit present.     Mental Status: She is alert.  Psychiatric:        Mood and Affect: Mood normal.        Behavior: Behavior normal.        Thought Content: Thought content normal.      UC Treatments / Results  Labs (all labs ordered are listed, but only abnormal results are displayed) Labs Reviewed  SARS-COV-2 RNA, QUALITATIVE REAL-TIME RT-PCR    EKG   Radiology No results found.  Procedures Procedures (including critical care time)  Medications Ordered in UC Medications - No data to display  Initial  Impression / Assessment and Plan / UC Course  I have reviewed the triage vital signs and the nursing notes.  Pertinent labs & imaging results that were available during my care of the patient were reviewed by me and considered in my medical decision making (see chart for details).     Final Clinical Impressions(s) / UC Diagnoses   Final diagnoses:  Exposure to COVID-19 virus  Close exposure to COVID-19 virus     Discharge Instructions     Stay out of work until Wednesday unless you develop symptoms or your test today is positive.  If no symptoms but test is positive, you may return to work next Friday. If you develop symptoms, you must remain out of work for a week AND until your symptoms are improving and you have had no fever for over 24 hours.    ED Prescriptions    None     PDMP not reviewed this encounter.   Robyn Haber, MD 07/13/19 2000

## 2019-07-13 NOTE — Discharge Instructions (Addendum)
Stay out of work until Wednesday unless you develop symptoms or your test today is positive.  If no symptoms but test is positive, you may return to work next Friday. If you develop symptoms, you must remain out of work for a week AND until your symptoms are improving and you have had no fever for over 24 hours.

## 2019-07-13 NOTE — ED Triage Notes (Signed)
Pt was exposed to covid by a family member

## 2019-07-17 LAB — SARS-COV-2 RNA,(COVID-19) QUALITATIVE NAAT: SARS CoV2 RNA: NOT DETECTED

## 2019-07-22 ENCOUNTER — Encounter (HOSPITAL_COMMUNITY): Payer: Self-pay | Admitting: *Deleted

## 2019-07-22 ENCOUNTER — Emergency Department (HOSPITAL_COMMUNITY): Payer: BC Managed Care – PPO

## 2019-07-22 ENCOUNTER — Emergency Department (HOSPITAL_COMMUNITY)
Admission: EM | Admit: 2019-07-22 | Discharge: 2019-07-22 | Disposition: A | Discharge status: Home / Self Care | Payer: BC Managed Care – PPO | Attending: Emergency Medicine | Admitting: Emergency Medicine

## 2019-07-22 ENCOUNTER — Other Ambulatory Visit: Payer: Self-pay

## 2019-07-22 DIAGNOSIS — R002 Palpitations: Secondary | ICD-10-CM | POA: Diagnosis present

## 2019-07-22 DIAGNOSIS — F17210 Nicotine dependence, cigarettes, uncomplicated: Secondary | ICD-10-CM | POA: Insufficient documentation

## 2019-07-22 DIAGNOSIS — Z79899 Other long term (current) drug therapy: Secondary | ICD-10-CM | POA: Diagnosis not present

## 2019-07-22 DIAGNOSIS — R079 Chest pain, unspecified: Secondary | ICD-10-CM | POA: Insufficient documentation

## 2019-07-22 LAB — BASIC METABOLIC PANEL
Anion gap: 10 (ref 5–15)
BUN: 6 mg/dL (ref 6–20)
CO2: 23 mmol/L (ref 22–32)
Calcium: 9.4 mg/dL (ref 8.9–10.3)
Chloride: 107 mmol/L (ref 98–111)
Creatinine, Ser: 0.79 mg/dL (ref 0.44–1.00)
GFR calc Af Amer: >60 mL/min (ref >60)
GFR calc non Af Amer: >60 mL/min (ref >60)
Glucose, Bld: 89 mg/dL (ref 70–99)
Potassium: 3.5 mmol/L (ref 3.5–5.1)
Sodium: 140 mmol/L (ref 135–145)

## 2019-07-22 LAB — CBC
HCT: 42.3 % (ref 36.0–46.0)
Hemoglobin: 13.6 g/dL (ref 12.0–15.0)
MCH: 27 pg (ref 26.0–34.0)
MCHC: 32.2 g/dL (ref 30.0–36.0)
MCV: 84.1 fL (ref 80.0–100.0)
Platelets: 327 10*3/uL (ref 150–400)
RBC: 5.03 MIL/uL (ref 3.87–5.11)
RDW: 12.4 % (ref 11.5–15.5)
WBC: 8.2 10*3/uL (ref 4.0–10.5)
nRBC: 0 % (ref 0.0–0.2)

## 2019-07-22 LAB — TSH: TSH: 1.579 u[IU]/mL (ref 0.350–4.500)

## 2019-07-22 LAB — MAGNESIUM: Magnesium: 2 mg/dL (ref 1.7–2.4)

## 2019-07-22 LAB — I-STAT BETA HCG BLOOD, ED (MC, WL, AP ONLY): I-stat hCG, quantitative: <5 m[IU]/mL (ref <5)

## 2019-07-22 LAB — TROPONIN I (HIGH SENSITIVITY): Troponin I (High Sensitivity): <2 ng/L (ref <18)

## 2019-07-22 MED ORDER — SODIUM CHLORIDE 0.9% FLUSH: 3.0000 mL | Freq: Once | INTRAVENOUS | Status: DC

## 2019-07-22 NOTE — ED Notes (Signed)
Patient verbalizes understanding of discharge instructions. Opportunity for questioning and answers were provided. pt discharged from ED with friend.   

## 2019-07-22 NOTE — Discharge Instructions (Addendum)
Follow-up with primary doctor as discussed. Discussed with cardiology if they recommend Holter monitor or further work-up.

## 2019-07-22 NOTE — ED Provider Notes (Signed)
[facility] Provider Note   CSN: [id number] Arrival date & time: 07/22/19  1722     History   Chief Complaint Chief Complaint  Patient presents with  . Chest Pain    HPI [name] is a 23 y.o. female.     HPI  Pt is a 23 year old female with PMH of palpitations/SVT on diltiazem who presents to the ED with concern for palpitations. Patient reports she has a history of palpitations and arrhythmia.  She states she is on diltiazem for this and follows with cardiology but has not seen them since the summer.  She states over the last few days she has been noticing it on and off fluttering sensation in her heart.  She states she does not have any overt chest pain just a fluttering pressure sensation at times.  This is similar to symptoms she has had in the past.  Of note, patient has not taken her diltiazem today but states it does not help anyway.  Patient denies any fever, cough, shortness of breath.  She denies any recent illness.  Patient pushes currently on her menstrual cycle and has abdominal cramping that is typical of prior cycles but no overt abdominal pain, nausea, vomiting.  No leg swelling or pain.  No prior history of blood clots.  Past Medical History:  Diagnosis Date  . Back pain   . Chlamydia infection 01/03/2017  . Neck pain   . Shoulder pain, right    frozen shoulder  . SVT (supraventricular tachycardia) [facility]     Patient Active Problem List   Diagnosis Date Noted  . Chlamydia infection 01/03/2017  . Abnormal uterine bleeding (AUB) 12/31/2016  . IUD (intrauterine device) in place 12/31/2016  . LLQ pain 12/31/2016  . Encounter for IUD insertion 08/31/2016    Past Surgical History:  Procedure Laterality Date  . NO PAST SURGERIES    . SHOULDER SURGERY       OB History   No obstetric history on file.      Home Medications    Prior to Admission medications   Medication Sig Start Date End Date Taking?  Authorizing Provider  diltiazem (CARDIZEM) 30 MG tablet TAKE 1 TABLET TWICE DAILY AS NEEDED 02/22/19   Antoine PocheBranch, Jonathan F, MD  ibuprofen (ADVIL,MOTRIN) 200 MG tablet Take 800 mg by mouth every 4 (four) hours as needed.    [provider]  Levonorgestrel (KYLEENA) 19.5 MG IUD by Intrauterine route.    [provider]    Family History Family History  Problem Relation Age of Onset  . Cancer Paternal Grandfather        prostate  . Hypertension Paternal Grandfather   . Hypertension Paternal Grandmother   . Hypertension Father   . Ovarian cysts Mother   . Ovarian cancer Other     Social History Social History   Tobacco Use  . Smoking status: Current Every Day Smoker    Packs/day: 0.50    Years: 5.00    Pack years: 2.50    Types: Cigarettes  . Smokeless tobacco: Never Used  Substance Use Topics  . Alcohol use: Not Currently  . Drug use: Never     Allergies   Other and Codeine   Review of Systems Review of Systems  Constitutional: Negative for chills and fever.  HENT: Negative for ear pain.   Eyes: Negative for pain and visual disturbance.  Respiratory: Negative for cough and shortness of breath.  Cardiovascular: Positive for palpitations. Negative for chest pain.  Gastrointestinal: Negative for abdominal pain and vomiting.  Genitourinary: Negative for dysuria and hematuria.  Musculoskeletal: Negative for arthralgias and back pain.  Skin: Negative for color change and rash.  Neurological: Negative for seizures and syncope.  Psychiatric/Behavioral: Negative for agitation and behavioral problems.  All other systems reviewed and are negative.    Physical Exam Updated Vital Signs BP 119/69 (BP Location: Right Arm)   Pulse 84   Temp 98.8 F (37.1 C) (Oral)   Resp 14   Ht 5\' 3"  (1.6 m)   Wt 77.1 kg   LMP 07/22/2019   SpO2 100%   BMI 30.11 kg/m   Physical Exam Vitals signs and nursing note reviewed.  Constitutional:      General: She is not  in acute distress.    Appearance: She is well-developed.  HENT:     Head: Normocephalic and atraumatic.  Eyes:     Conjunctiva/sclera: Conjunctivae normal.  Neck:     Musculoskeletal: Neck supple.  Cardiovascular:     Rate and Rhythm: Normal rate and regular rhythm.     Pulses: Normal pulses.     Heart sounds: No murmur.  Pulmonary:     Effort: Pulmonary effort is normal. No respiratory distress.     Breath sounds: Normal breath sounds.  Abdominal:     Palpations: Abdomen is soft.     Tenderness: There is no abdominal tenderness.  Musculoskeletal:        General: No tenderness.     Right lower leg: No edema.     Left lower leg: No edema.  Skin:    General: Skin is warm and dry.  Neurological:     General: No focal deficit present.     Mental Status: She is alert and oriented to person, place, and time.  Psychiatric:        Mood and Affect: Mood normal.        Behavior: Behavior normal.      ED Treatments / Results  Labs (all labs ordered are listed, but only abnormal results are displayed) Labs Reviewed  BASIC METABOLIC PANEL  CBC  MAGNESIUM  TSH  I-STAT BETA HCG BLOOD, ED ([facility], [facility], [facility] ONLY)  TROPONIN I (HIGH SENSITIVITY)    EKG None  Radiology Dg Chest 2 View  Result Date: 07/22/2019 CLINICAL DATA:  23 year old female with chest pain. EXAM: CHEST - 2 VIEW COMPARISON:  Chest radiograph dated 09/04/2015 FINDINGS: The heart size and mediastinal contours are within normal limits. Both lungs are clear. The visualized skeletal structures are unremarkable. IMPRESSION: No active cardiopulmonary disease. Electronically Signed   By: 09/06/2015 M.D.   On: 07/22/2019 19:50    Procedures Procedures (including critical care time)  Medications Ordered in ED Medications  sodium chloride flush (NS) 0.9 % injection 3 mL (has no administration in time range)     Initial Impression / Assessment and Plan / ED Course  I have reviewed the triage vital signs and the  nursing notes.  Pertinent labs & imaging results that were available during my care of the patient were reviewed by me and considered in my medical decision making (see chart for details).        On arrival, pt is afebrile, HDS, well appearing  EKG: NSR, no obvious ischemic findings or evidence of arrhythmia. Specifically, no evidence of Brugada/HOCM/WPW  Patient denies any overt chest pain.  She states she just at times feels intermittent fluttering in  her chest.  No shortness of breath.  PERC negative, low suspicion for PE.  Considered: Arrhythmia such as paroxysmal SVT vs PACs, PVCs that are symptomatic.  Also considered things such as sinus sick syndrome as patient reports sometimes her watch monitors her heart rate and states that heart rate is in the 40s and other times it is in the 100s.  However, lower suspicion for this is patient is a young healthy female otherwise.  Patient is experiencing palpitations in setting of underlying known palpitations and missed diltiazem dosing. Screening labs without significant electrolyte abnormality or evidence of thyroid disorder.  Chest x-ray within normal limits  Patient monitored on telemetry without any acute events noted. She is HDS and well appearing. Patient to be discharged with outpatient cardiology follow-up.  Stable for discharge at this time.  Strict return precautions given.  Final Clinical Impressions(s) / ED Diagnoses   Final diagnoses:  Chest pain, unspecified type    ED Discharge Orders    None       Burns Spain, MD 07/22/19 3149    Elnora Morrison, MD 07/23/19 [phone]

## 2019-07-22 NOTE — ED Triage Notes (Signed)
The pt I c/o chest pain since yesterday with sob nausea  lmp  now

## 2019-07-24 ENCOUNTER — Encounter: Payer: Self-pay | Admitting: Student

## 2019-07-24 ENCOUNTER — Telehealth (INDEPENDENT_AMBULATORY_CARE_PROVIDER_SITE_OTHER): Payer: BC Managed Care – PPO | Admitting: Student

## 2019-07-24 VITALS — BP 120/70 | Ht 63.0 in | Wt 170.0 lb

## 2019-07-24 DIAGNOSIS — Z720 Tobacco use: Secondary | ICD-10-CM

## 2019-07-24 DIAGNOSIS — R002 Palpitations: Secondary | ICD-10-CM | POA: Diagnosis not present

## 2019-07-24 MED ORDER — DILTIAZEM HCL 30 MG PO TABS: ORAL_TABLET | ORAL | 3 refills | Status: DC

## 2019-07-24 NOTE — Progress Notes (Signed)
Virtual Visit via Telephone Note   This visit type was conducted due to national recommendations for restrictions regarding the COVID-19 Pandemic (e.g. social distancing) in an effort to limit this patient's exposure and mitigate transmission in our community.  Due to her co-morbid illnesses, this patient is at least at moderate risk for complications without adequate follow up.  This format is felt to be most appropriate for this patient at this time.  The patient did not have access to video technology/had technical difficulties with video requiring transitioning to audio format only (telephone).  All issues noted in this document were discussed and addressed.  No physical exam could be performed with this format.  Please refer to the patient's chart for her  consent to telehealth for [facility].   Date:  07/24/2019   ID:  Bethany Navarro, DOB Aug 27, 1995, MRN [id number]  Patient Location: Home Provider Location: Office  PCP:  Patient, No Pcp Per  Cardiologist:  Carlyle Dolly, MD  Electrophysiologist:  None   Evaluation Performed:  Follow-Up Visit  Chief Complaint: Palpitations  History of Present Illness:    Bethany Navarro is a 23 y.o. female with medical history of palpitations and tobacco use who presents for a follow-up telehealth visit in regards to her recent Emergency Department evaluation.  She was last examined by Dr. Harl Bowie in 02/2019 and recent event monitor had shown sinus rhythm with occasional episodes of sinus tachycardia but no significant arrhythmias. She was started on Lopressor 12.5 mg twice daily but did report fatigue with this. It was recommended that she discontinue Lopressor and start Diltiazem 30 mg as needed.  She most recently presented to [facility] ED on 07/22/2019 for intermittent palpitations over the past several days. Labs showed WBC 8.2, Hgb 13.6, platelets 327, Na+ 140, K+ 3.5 and creatinine 0.79. Troponin values were negative and TSH  was within normal limits. Pregnancy test negative. CXR showed no acute findings. EKG showed normal sinus rhythm, heart rate 85, with no acute ST changes. She was discharged home and informed to follow-up with Cardiology as an outpatient.  In talking with the patient today, she reports having palpitations intermittently over the past several months but symptoms acutely worsened this past weekend which prompted her to go to the ED. She reports feeling like her heart was beating out of her chest with associated chest pressure at that time. She has been following her heart rate at home and says this has been variable from the 50's up to 160's at times. Instead of taking her Cardizem as needed she had actually been taking it as scheduled twice daily dosing but following her ED visit, she has not taken the medication since. She reports palpitations this morning and says symptoms have been worse when going from sitting to standing or for standing an extended period of time such as while washing dishes.  She has reduced her caffeine intake over the past several months and consumes 2 cups of coffee daily. Denies any recent alcohol use and is only a social drinker. She was previously smoking a pack per day but has reduced her use to less than a pack per week over the past several months.  The patient does not have symptoms concerning for COVID-19 infection (fever, chills, cough, or new shortness of breath).    Past Medical History:  Diagnosis Date  . Back pain   . Chlamydia infection 01/03/2017  . Neck pain   . Shoulder pain, right    frozen  shoulder  . SVT (supraventricular tachycardia) (HCC)    Past Surgical History:  Procedure Laterality Date  . NO PAST SURGERIES    . SHOULDER SURGERY       Current Meds  Medication Sig  . diltiazem (CARDIZEM) 30 MG tablet TAKE 1 TABLET TWICE DAILY AS NEEDED  . Levonorgestrel (KYLEENA) 19.5 MG IUD by Intrauterine route.     Allergies:   Other and Codeine    Social History   Tobacco Use  . Smoking status: Current Every Day Smoker    Packs/day: 0.50    Years: 5.00    Pack years: 2.50    Types: Cigarettes  . Smokeless tobacco: Never Used  Substance Use Topics  . Alcohol use: Not Currently  . Drug use: Never     Family Hx: The patient's family history includes Cancer in her paternal grandfather; Hypertension in her father, paternal grandfather, and paternal grandmother; Ovarian cancer in an other family member; Ovarian cysts in her mother.  ROS:   Please see the history of present illness.     All other systems reviewed and are negative.   Prior CV studies:   The following studies were reviewed today:  Event Monitor: 11/2018  21 day event monitor  Min HR 49, Max HR 166, Avg HR 82  Symptoms correlate with sinus rhythm and sinus tachycardia  No significant arrhythmias  Labs/Other Tests and Data Reviewed:    EKG:  An ECG dated 07/22/2019 was personally reviewed today and demonstrated:  normal sinus rhythm, heart rate 85, with no acute ST changes  Recent Labs: 10/26/2018: ALT 38 07/22/2019: BUN 6; Creatinine, Ser 0.79; Hemoglobin 13.6; Magnesium 2.0; Platelets 327; Potassium 3.5; Sodium 140; TSH 1.579   Recent Lipid Panel No results found for: CHOL, TRIG, HDL, CHOLHDL, LDLCALC, LDLDIRECT  Wt Readings from Last 3 Encounters:  07/24/19 170 lb (77.1 kg)  07/22/19 170 lb (77.1 kg)  07/13/19 170 lb (77.1 kg)     Objective:    Vital Signs:  BP 120/70   Ht 5\' 3"  (1.6 m)   Wt 170 lb (77.1 kg)   LMP 07/22/2019   BMI 30.11 kg/m    General: Pleasant female sounding in NAD Psych: Normal affect. Neuro: Alert and oriented X 3. Lungs:  Resp regular and unlabored while talking on the phone.    ASSESSMENT & PLAN:    1. Palpitations - Recent event monitor earlier this year showed sinus rhythm and sinus tachycardia with no significant arrhythmias. Labs during recent ED evaluation showed normal electrolytes and TSH. She does  have an iWatch that can follow her rhythm strip and I encouraged her to monitor this when symptoms occur and she can send 14/01/2019 copies of her rhythm strip by MyChart for review. - Continued reduction in caffeine intake was encouraged. She does not consume alcohol regularly. She had actually been taking Cardizem 30 mg twice daily and stopped this at the time of her ED visit. I recommended that she take 30 mg as needed for palpitations and that she could take an extra tablet within 1 to 2 hours (up to 3 tablets daily) if HR remains elevated and BP is stable. Could consider Cardizem CD if utilizing PRN medications daily but would need to follow BP closely. Previously intolerant to BB therapy.  - Her palpitations and tachycardia typically worsen when going from sitting to standing and it is possible she has a component of POTS. Recommended she increase her fluid intake to 2 L per day and  utilize compression stockings to see if this helps with symptoms.   2. Tobacco Use - she has reduced her use from 1 ppd to less than 1 pack per week. Congratulated on her reduction with cessation advised.     COVID-19 Education: The signs and symptoms of COVID-19 were discussed with the patient and how to seek care for testing (follow up with PCP or arrange E-visit).  The importance of social distancing was discussed today.  Time:   Today, I have spent 19 minutes with the patient with telehealth technology discussing the above problems.     Medication Adjustments/Labs and Tests Ordered: Current medicines are reviewed at length with the patient today.  Concerns regarding medicines are outlined above.   Tests Ordered: No orders of the defined types were placed in this encounter.   Medication Changes: Meds ordered this encounter  Medications  . diltiazem (CARDIZEM) 30 MG tablet    Sig: TAKE 1 TABLET TWICE DAILY AS NEEDED    Dispense:  180 tablet    Refill:  3    Order Specific Question:   Supervising Provider     Answer:   Lars MassonELSON, KATARINA H [[id number]]    Follow Up:  Either In Person or Virtual in 2 month(s)  Signed, Ellsworth LennoxBrittany M Rambo Sarafian, PA-C  07/24/2019 3:31 PM    [facility]

## 2019-07-24 NOTE — Patient Instructions (Addendum)
Medication Instructions:  Your physician recommends that you continue on your current medications as directed. Please refer to the Current Medication list given to you today.  *If you need a refill on your cardiac medications before your next appointment, please call your pharmacy*  Lab Work: NONE   If you have labs (blood work) drawn today and your tests are completely normal, you will receive your results only by: Marland Kitchen MyChart Message (if you have MyChart) OR . A paper copy in the mail If you have any lab test that is abnormal or we need to change your treatment, we will call you to review the results.  Testing/Procedures: NONE   Follow-Up: At [facility], you and your health needs are our priority.  As part of our continuing mission to provide you with exceptional heart care, we have created designated Provider Care Teams.  These Care Teams include your primary Cardiologist (physician) and Advanced Practice Providers (APPs -  Physician Assistants and Nurse Practitioners) who all work together to provide you with the care you need, when you need it.  Your next appointment:   2 month(s)  The format for your next appointment:   Virtual Visit   Provider:   You may see Carlyle Dolly, MD or one of the following Advanced Practice Providers on your designated Care Team:    Bernerd Pho, PA-C   Ermalinda Barrios, PA-C    Other Instructions  Increase Fluid intake to 2 Liters a day  Purchase compression Stockings   Thank you for choosing [facility]!     Postural Orthostatic Tachycardia Syndrome Postural orthostatic tachycardia syndrome (POTS) is a group of symptoms that occur when a person stands up after lying down. POTS occurs when less blood than normal flows to the body when you stand up. The reduced blood flow to the body makes the heart beat rapidly. POTS may be associated with another medical condition, or it may occur on its own. What are the causes? The  cause of this condition is not known, but many conditions and diseases are associated with it. What increases the risk? This condition is more likely to develop in:  Women 43-60 years old.  Women who are pregnant.  Women who are in their period (menstruating).  People who have certain conditions, such as: ? Infection from a virus. ? Attacks of healthy organs by the body's immunity (autoimmune disease). ? Losing a lot of red blood cells (anemia). ? Losing too much water in the body (dehydration). ? An overactive thyroid (hyperthyroidism).  People who take certain medicines.  People who have had a major injury.  People who have had surgery. What are the signs or symptoms? The most common symptom of this condition is light-headedness when one stands from a lying or sitting position. Other symptoms may include:  Feeling a rapid increase in the heartbeat (tachycardia) within 10 minutes of standing up.  Fainting.  Weakness.  Confusion.  Trembling.  Shortness of breath.  Sweating or flushing.  Headache.  Chest pain.  Breathing that is deeper and faster than normal (hyperventilation).  Nausea.  Anxiety. Symptoms may be worse in the morning, and they may be relieved by lying down. How is this diagnosed? This condition is diagnosed based on:  Your symptoms.  Your medical history.  A physical exam.  Checking your heart rate when you are lying down and after you stand up.  Checking your blood pressure when you go from lying down to standing up.  Blood  tests to measure hormones that change with blood pressure. The blood tests will be done when you are lying down and when you are standing up. You may have other tests to check for conditions or diseases that are associated with POTS. How is this treated? Treatment for this condition depends on how severe your symptoms are and whether you have any conditions or diseases that are associated with POTS. Treatment may  involve:  Treating any conditions or diseases that are associated with POTS.  Drinking two glasses of water before getting up from a lying position.  Eating more salt (sodium).  Taking medicine to control blood pressure and heart rate (beta-blocker).  Avoiding certain medicines.  Starting an exercise program under the supervision of a health care provider. Follow these instructions at home: Medicines  Take over-the-counter and prescription medicines only as told by your health care provider.  Let your health care provider know about all prescription or over-the-counter medicines. These include herbs, vitamins, and supplements. You may need to stop or adjust some medicines if they cause this condition.  Talk with your health care provider before starting any new medicines. Eating and drinking   Drink enough fluid to keep your urine pale yellow.  If told by your health care provider, drink two glasses of water before getting up from a lying position.  Follow instructions from your health care provider about how much sodium you should eat.  Avoid heavy meals. Eat several small meals a day instead of a few large meals. General instructions  Do an aerobic exercise for 20 minutes a day, at least 3 days a week.  Ask your health care provider what kinds of exercise are safe for you.  Do not use any products that contain nicotine or tobacco, such as cigarettes and e-cigarettes. These can interfere with blood flow. If you need help quitting, ask your health care provider.  Keep all follow-up visits as told by your health care provider. This is important. Contact a health care provider if:  Your symptoms do not improve after treatment.  Your symptoms get worse.  You develop new symptoms. Get help right away if:  You have chest pain.  You have difficulty breathing.  You have fainting episodes. These symptoms may represent a serious problem that is an emergency. Do not wait  to see if the symptoms will go away. Get medical help right away. Call your local emergency services (911 in the [place] Do not drive yourself to the hospital. Summary  POTS is a condition that can cause light-headedness, fainting, and palpitations when you go from a sitting or lying position to a standing position. It occurs when less blood than normal flows to the body when you stand up.  Treatment for this condition includes treating any underlying conditions, drinking plenty of water, stopping or changing some medicines, or starting an exercise program.  Get help right away if you have chest pain, difficulty breathing, or fainting episodes. These may represent a serious problem that is an emergency. This information is not intended to replace advice given to you by your health care provider. Make sure you discuss any questions you have with your health care provider. Document Released: 07/23/2002 Document Revised: 09/13/2017 Document Reviewed: 09/13/2017 Elsevier Patient Education  2020 [employer].

## 2019-09-25 ENCOUNTER — Emergency Department (HOSPITAL_COMMUNITY)
Admission: EM | Admit: 2019-09-25 | Discharge: 2019-09-25 | Disposition: A | Discharge status: Home / Self Care | Payer: 59 | Attending: Emergency Medicine | Admitting: Emergency Medicine

## 2019-09-25 ENCOUNTER — Telehealth: Payer: Self-pay | Admitting: Cardiology

## 2019-09-25 ENCOUNTER — Emergency Department (HOSPITAL_COMMUNITY): Payer: 59

## 2019-09-25 ENCOUNTER — Other Ambulatory Visit: Payer: Self-pay

## 2019-09-25 ENCOUNTER — Telehealth: Payer: Self-pay

## 2019-09-25 ENCOUNTER — Encounter (HOSPITAL_COMMUNITY): Payer: Self-pay | Admitting: Emergency Medicine

## 2019-09-25 DIAGNOSIS — F17210 Nicotine dependence, cigarettes, uncomplicated: Secondary | ICD-10-CM | POA: Insufficient documentation

## 2019-09-25 DIAGNOSIS — R000 Tachycardia, unspecified: Secondary | ICD-10-CM | POA: Diagnosis present

## 2019-09-25 DIAGNOSIS — I471 Supraventricular tachycardia: Secondary | ICD-10-CM | POA: Insufficient documentation

## 2019-09-25 LAB — CBC WITH DIFFERENTIAL/PLATELET
Abs Immature Granulocytes: 0.02 10*3/uL (ref 0.00–0.07)
Basophils Absolute: 0 10*3/uL (ref 0.0–0.1)
Basophils Relative: 1 %
Eosinophils Absolute: 0 10*3/uL (ref 0.0–0.5)
Eosinophils Relative: 1 %
HCT: 40.8 % (ref 36.0–46.0)
Hemoglobin: 13.1 g/dL (ref 12.0–15.0)
Immature Granulocytes: 0 %
Lymphocytes Relative: 23 %
Lymphs Abs: 2 10*3/uL (ref 0.7–4.0)
MCH: 27.2 pg (ref 26.0–34.0)
MCHC: 32.1 g/dL (ref 30.0–36.0)
MCV: 84.8 fL (ref 80.0–100.0)
Monocytes Absolute: 0.5 10*3/uL (ref 0.1–1.0)
Monocytes Relative: 6 %
Neutro Abs: 6.1 10*3/uL (ref 1.7–7.7)
Neutrophils Relative %: 69 %
Platelets: 332 10*3/uL (ref 150–400)
RBC: 4.81 MIL/uL (ref 3.87–5.11)
RDW: 12.6 % (ref 11.5–15.5)
WBC: 8.6 10*3/uL (ref 4.0–10.5)
nRBC: 0 % (ref 0.0–0.2)

## 2019-09-25 LAB — URINALYSIS, ROUTINE W REFLEX MICROSCOPIC
Bacteria, UA: NONE SEEN
Bilirubin Urine: NEGATIVE
Glucose, UA: NEGATIVE mg/dL
Hgb urine dipstick: NEGATIVE
Ketones, ur: NEGATIVE mg/dL
Nitrite: NEGATIVE
Protein, ur: NEGATIVE mg/dL
Specific Gravity, Urine: 1.01 (ref 1.005–1.030)
pH: 6 (ref 5.0–8.0)

## 2019-09-25 LAB — BASIC METABOLIC PANEL
Anion gap: 10 (ref 5–15)
BUN: 9 mg/dL (ref 6–20)
CO2: 28 mmol/L (ref 22–32)
Calcium: 9 mg/dL (ref 8.9–10.3)
Chloride: 103 mmol/L (ref 98–111)
Creatinine, Ser: 0.77 mg/dL (ref 0.44–1.00)
GFR calc Af Amer: >60 mL/min (ref >60)
GFR calc non Af Amer: >60 mL/min (ref >60)
Glucose, Bld: 103 mg/dL — ABNORMAL HIGH (ref 70–99)
Potassium: 3.7 mmol/L (ref 3.5–5.1)
Sodium: 141 mmol/L (ref 135–145)

## 2019-09-25 LAB — TROPONIN I (HIGH SENSITIVITY)
Troponin I (High Sensitivity): <2 ng/L (ref <18)
Troponin I (High Sensitivity): <2 ng/L (ref <18)

## 2019-09-25 LAB — POC URINE PREG, ED: Preg Test, Ur: NEGATIVE

## 2019-09-25 MED ORDER — SODIUM CHLORIDE 0.9 % IV BOLUS
1000.0000 mL | Freq: Once | INTRAVENOUS | Status: AC
  Administered 2019-09-25: 1000 mL via INTRAVENOUS

## 2019-09-25 NOTE — ED Triage Notes (Addendum)
Pt has history SVT. Pt states she became tachycardic at 0730. HR at home 160's with sob and dizziness. Took diltiazem at 0815 this morning.

## 2019-09-25 NOTE — Telephone Encounter (Signed)
Pt states that when she stands her heart rate is 160 and she is SOB. Pt states that when she lies down her her HR is in the 90's. Current HR it 140. States that she has been feeing bad for two days.  Pt encouraged to be seen in the ER.

## 2019-09-25 NOTE — ED Provider Notes (Signed)
[facility] EMERGENCY DEPARTMENT Provider Note   CSN: [id number] Arrival date & time: 09/25/19  1109     History Chief Complaint  Patient presents with  . Tachycardia    Bethany Navarro is a 24 y.o. female with a history as outlined below, most significant for history of intermittent SVT for which she uses as needed diltiazem 30 mg tablets presenting with a several day history of intermittent episodes of palpitations in association with lightheadedness and generalized weakness.  Typically when she has an episode of SVT she can lie down and the symptoms resolve after several minutes.  She reports her symptoms tend to worsen when she is under either emotional or physical stress which she has been with her job recently.  This morning she had an episode at work which lasted for more than an hour despite lying supine, she measured her heart rate as high as 160 and had shortness of breath and chest pressure in association with lightheadedness with attempts at ambulation.  Her symptoms are currently resolved.  She has taken 30 mg of diltiazem prior to arrival.  She called her cardiologist but was advised to come here for further evaluation.  Is currently symptom-free.  She denies any increased caffeine use, states she drinks 1 cup of coffee daily.  She denies new medications   HPI     Past Medical History:  Diagnosis Date  . Back pain   . Chlamydia infection 01/03/2017  . Neck pain   . Shoulder pain, right    frozen shoulder  . SVT (supraventricular tachycardia) [facility])     Patient Active Problem List   Diagnosis Date Noted  . Chlamydia infection 01/03/2017  . Abnormal uterine bleeding (AUB) 12/31/2016  . IUD (intrauterine device) in place 12/31/2016  . LLQ pain 12/31/2016  . Encounter for IUD insertion 08/31/2016    Past Surgical History:  Procedure Laterality Date  . NO PAST SURGERIES    . SHOULDER SURGERY       OB History   No obstetric history on file.     Family History   Problem Relation Age of Onset  . Cancer Paternal Grandfather        prostate  . Hypertension Paternal Grandfather   . Hypertension Paternal Grandmother   . Hypertension Father   . Ovarian cysts Mother   . Ovarian cancer Other     Social History   Tobacco Use  . Smoking status: Current Every Day Smoker    Packs/day: 0.50    Years: 5.00    Pack years: 2.50    Types: Cigarettes  . Smokeless tobacco: Current User  Substance Use Topics  . Alcohol use: Not Currently  . Drug use: Never    Home Medications Prior to Admission medications   Medication Sig Start Date End Date Taking? Authorizing Provider  diltiazem (CARDIZEM) 30 MG tablet TAKE 1 TABLET TWICE DAILY AS NEEDED Patient taking differently: Take 30 mg by mouth 2 (two) times daily as needed. TAKE 1 TABLET TWICE DAILY AS NEEDED 07/24/19  Yes Strader, [place] M, PA-C  hydrOXYzine (ATARAX/VISTARIL) 25 MG tablet Take 2 tablets by mouth 3 (three) times daily as needed. 02/24/19  Yes [provider]  Levonorgestrel (KYLEENA) 19.5 MG IUD 1 each by Intrauterine route once.    Yes [provider]  risperiDONE (RISPERDAL) 3 MG tablet Take 3 mg by mouth at bedtime.   Yes [provider]    Allergies    Bee venom and Codeine  Review  of Systems   Review of Systems  Constitutional: Negative for chills and fever.  HENT: Negative for congestion.   Eyes: Negative.   Respiratory: Positive for chest tightness. Negative for shortness of breath.   Cardiovascular: Positive for palpitations. Negative for chest pain.  Gastrointestinal: Negative for abdominal pain, nausea and vomiting.  Genitourinary: Negative.   Musculoskeletal: Negative for arthralgias, joint swelling and neck pain.  Skin: Negative.  Negative for rash and wound.  Neurological: Positive for light-headedness. Negative for dizziness, numbness and headaches.  Psychiatric/Behavioral: Negative.     Physical Exam Updated Vital Signs BP 127/76    Pulse 87   Temp 98.1 F (36.7 C) (Oral)   Resp 18   Wt 77.1 kg   SpO2 100%   BMI 30.11 kg/m   Physical Exam Vitals and nursing note reviewed.  Constitutional:      Appearance: She is well-developed.  HENT:     Head: Normocephalic and atraumatic.  Eyes:     Conjunctiva/sclera: Conjunctivae normal.  Cardiovascular:     Rate and Rhythm: Normal rate and regular rhythm.     Heart sounds: Normal heart sounds.  Pulmonary:     Effort: Pulmonary effort is normal.     Breath sounds: Normal breath sounds. No wheezing.  Abdominal:     General: Bowel sounds are normal.     Palpations: Abdomen is soft.     Tenderness: There is no abdominal tenderness.  Musculoskeletal:        General: Normal range of motion.     Cervical back: Normal range of motion.  Skin:    General: Skin is warm and dry.  Neurological:     Mental Status: She is alert.     ED Results / Procedures / Treatments   Labs (all labs ordered are listed, but only abnormal results are displayed) Labs Reviewed  BASIC METABOLIC PANEL - Abnormal; Notable for the following components:      Result Value   Glucose, Bld 103 (*)    All other components within normal limits  URINALYSIS, ROUTINE W REFLEX MICROSCOPIC - Abnormal; Notable for the following components:   Leukocytes,Ua SMALL (*)    All other components within normal limits  CBC WITH DIFFERENTIAL/PLATELET  POC URINE PREG, ED  TROPONIN I (HIGH SENSITIVITY)  TROPONIN I (HIGH SENSITIVITY)    EKG EKG Interpretation  Date/Time:  Tuesday September 25 2019 11:32:06 EST Ventricular Rate:  88 PR Interval:    QRS Duration: 98 QT Interval:  349 QTC Calculation: 423 R Axis:   42 Text Interpretation: Sinus rhythm RSR' in V1 or V2, right VCD or RVH Confirmed by Nat Christen [phone]) on 09/25/2019 4:58:43 PM   Radiology DG Chest Portable 1 View  Result Date: 09/25/2019 CLINICAL DATA:  Tachycardia, heart rate in 160s for 2 days, history of smoking, SVT EXAM: PORTABLE CHEST  1 VIEW COMPARISON:  Portable exam 1327 hours compared to 07/22/2019 FINDINGS: Normal heart size, mediastinal contours, and pulmonary vascularity. Lungs clear. No pulmonary infiltrate, pleural effusion or pneumothorax. Osseous structures unremarkable. IMPRESSION: No acute abnormalities. Electronically Signed   By: Lavonia Dana M.D.   On: 09/25/2019 13:39    Procedures Procedures (including critical care time)  Medications Ordered in ED Medications  sodium chloride 0.9 % bolus 1,000 mL (0 mLs Intravenous Stopped 09/25/19 1548)    ED Course  I have reviewed the triage vital signs and the nursing notes.  Pertinent labs & imaging results that were available during my care of the patient  were reviewed by me and considered in my medical decision making (see chart for details).    MDM Rules/Calculators/A&P                      Pt sx free during ed visit.  Her pulse rate increased to 120 when standing initially during her stay, but asymptomatic.  She was given IV fluids, repeat orthostatics, pulse 77 to 100 sitting to standing, but no sx.    Discussed pt with Dr. Wyline Mood - he recommends diltiazem bid daily even when asymptomatic, then prn if she has an episode of palpitations.  Will plan close office f/u in 3 days.  Pt made aware of plan and is agreeable.  Stable for dc home.  Final Clinical Impression(s) / ED Diagnoses Final diagnoses:  SVT (supraventricular tachycardia) [facility])    Rx / DC Orders ED Discharge Orders    None       Victoriano Lain 09/25/19 1701    Geoffery Lyons, MD 09/28/19 [phone]

## 2019-09-25 NOTE — Telephone Encounter (Signed)
Discussed patient with ER staff, presents with palpitations. In ER EKG shows NSR, from patient report HR was 160 prior to arrival. Recent normal cardiac monitor. She has been taking diltiazem just prn, we have recommended dilt 30mg  bid along with additional 30mg  prn. Orthostatics show nearly 30 beat elevated of HR with standing. May have a component of POTs. Have asked ER staff to instruct patient on aggressive hydration and increased fluid intake. At her f/u assess if symptosm primarily are positional, if so would consider midodrone/florinef if ongoign symptoms with aggressive hydration, could consider compression stockigns.    MD

## 2019-09-25 NOTE — ED Notes (Signed)
Pt was informed that we need a urine sample. 

## 2019-09-25 NOTE — Discharge Instructions (Addendum)
Dr. Wyline Mood recommends that you increase your diltiazem to twice daily dosing whether you are having symptoms are not.  You may then take an additional dose of this medication if you have an active episode of palpitations.  Please make sure you are drinking plenty of fluids to maintain hydration.  He plans to see you on Friday at 130 for further evaluation of your symptoms.  Your lab tests today are reassuring.

## 2019-09-25 NOTE — Telephone Encounter (Signed)
Pt would like to know if she can see someone today.  HR 160's when standing./SOB She feels heart beating.  Please call [phone]    Thanks renee

## 2019-09-27 NOTE — Progress Notes (Signed)
Cardiology Office Note    Date:  09/28/2019   ID:  Bethany Navarro, DOB [birth year], MRN [id number]  PCP:  Rebekah Chesterfield, NP  Cardiologist: Dina Rich, MD    Chief Complaint  Patient presents with  . Follow-up    recent Emergency Dept visit    History of Present Illness:    Bethany Navarro is a 24 y.o. female with past medical history of palpitations and former tobacco use who presents to the office today for follow-up from a recent Emergency Department visit.  Recently had a telehealth visit with myself in 07/2019 following a recent ED visit for palpitations during which she was found to be in normal sinus rhythm and electrolytes and TSH were within normal limits. She reported having intermittent palpitations over the past several months and heart rate have been variable from the 50's to 160's when checked at home. It was recommended that she take Cardizem 30 mg as needed for palpitations and could take an extra tablet if needed up to 3 times daily. Was recommended to consider Cardizem CD if symptoms persisted as she had previously been intolerant to beta-blocker therapy. Her symptoms also worsened with positional changes and it was thought she might have a component of POTS, therefore she was encouraged to increase her fluid intake.  In the interim, she presented to [facility] ED on 09/25/2019 for recurrent palpitations and dizziness. She was in normal sinus rhythm upon arrival to the ED but her pulse did increase when orthostatics were obtained. She was given IVF and pulse still increased from 77 to 100 bpm with standing but she was asymptomatic with this. Findings were reviewed with Dr. Wyline Mood and it was recommended that she take Cardizem twice daily even if asymptomatic and utilize an extra tablet if recurrent symptoms with consideration of Midodrine or Florinef in the future.   In talking with the patient today, she reports her palpitations have improved since her ED evaluation  but she does experience palpitations and dizziness with positional changes. She has increased her fluid and salt intake along with wearing compression stockings but still has  symptoms. Estimates she is consuming 3L each day. She experiences dyspnea with her palpitations but denies any exertional chest pain, dyspnea on exertion, orthopnea or PND.   She has been under increased stress recently and is planning to start a new job next week.  Reports that behavioral health recently switched her medications and started her on Risperidone earlier this week.  Past Medical History:  Diagnosis Date  . Back pain   . Chlamydia infection 01/03/2017  . Neck pain   . Shoulder pain, right    frozen shoulder  . SVT (supraventricular tachycardia) (HCC)     Past Surgical History:  Procedure Laterality Date  . NO PAST SURGERIES    . SHOULDER SURGERY      Current Medications: Outpatient Medications Prior to Visit  Medication Sig Dispense Refill  . diltiazem (CARDIZEM) 30 MG tablet TAKE 1 TABLET TWICE DAILY AS NEEDED (Patient taking differently: Take 30 mg by mouth 2 (two) times daily as needed. TAKE 1 TABLET TWICE DAILY AS NEEDED) 180 tablet 3  . hydrOXYzine (ATARAX/VISTARIL) 25 MG tablet Take 2 tablets by mouth 3 (three) times daily as needed.    . Levonorgestrel (KYLEENA) 19.5 MG IUD 1 each by Intrauterine route once.     . risperiDONE (RISPERDAL) 3 MG tablet Take 3 mg by mouth at bedtime.     No facility-administered medications prior to  visit.     Allergies:   Bee venom and Codeine   Social History   Socioeconomic History  . Marital status: Single    Spouse name: Not on file  . Number of children: Not on file  . Years of education: Not on file  . Highest education level: Not on file  Occupational History  . Not on file  Tobacco Use  . Smoking status: Former Smoker    Packs/day: 0.50    Years: 5.00    Pack years: 2.50    Types: Cigarettes    Quit date: 08/28/2019    Years since  quitting: 0.0  . Smokeless tobacco: Current User  Substance and Sexual Activity  . Alcohol use: Not Currently  . Drug use: Never  . Sexual activity: Yes    Birth control/protection: I.U.D.  Other Topics Concern  . Not on file  Social History Narrative   ** Merged History Encounter **       Social Determinants of Health   Financial Resource Strain:   . Difficulty of Paying Living Expenses: Not on file  Food Insecurity:   . Worried About Programme researcher, broadcasting/film/video in the Last Year: Not on file  . Ran Out of Food in the Last Year: Not on file  Transportation Needs:   . Lack of Transportation (Medical): Not on file  . Lack of Transportation (Non-Medical): Not on file  Physical Activity:   . Days of Exercise per Week: Not on file  . Minutes of Exercise per Session: Not on file  Stress:   . Feeling of Stress : Not on file  Social Connections:   . Frequency of Communication with Friends and Family: Not on file  . Frequency of Social Gatherings with Friends and Family: Not on file  . Attends Religious Services: Not on file  . Active Member of Clubs or Organizations: Not on file  . Attends Banker Meetings: Not on file  . Marital Status: Not on file     Family History:  The patient's family history includes Cancer in her paternal grandfather; Hypertension in her father, paternal grandfather, and paternal grandmother; Ovarian cancer in an other family member; Ovarian cysts in her mother.   Review of Systems:   Please see the history of present illness.     General:  No chills, fever, night sweats or weight changes.  Cardiovascular:  No chest pain, dyspnea on exertion, edema, orthopnea, palpitations, paroxysmal nocturnal dyspnea. Positive for palpitations and dizziness. Dermatological: No rash, lesions/masses Respiratory: No cough, dyspnea Urologic: No hematuria, dysuria Abdominal:   No nausea, vomiting, diarrhea, bright red blood per rectum, melena, or  hematemesis Neurologic:  No visual changes, wkns, changes in mental status. All other systems reviewed and are otherwise negative except as noted above.   Physical Exam:    VS:  BP 132/87   Pulse 92   Temp (!) 97.5 F (36.4 C)   Ht 5\' 3"  (1.6 m)   Wt 175 lb (79.4 kg)   SpO2 99%   BMI 31.00 kg/m    General: Well developed, well nourished,female appearing in no acute distress. Head: Normocephalic, atraumatic, sclera non-icteric, no xanthomas, nares are without discharge.  Neck: No carotid bruits. JVD not elevated.  Lungs: Respirations regular and unlabored, without wheezes or rales.  Heart: Regular rate and rhythm. No S3 or S4.  No murmur, no rubs, or gallops appreciated. Abdomen: Soft, non-tender, non-distended with normoactive bowel sounds. No hepatomegaly. No rebound/guarding. No obvious abdominal  masses. Msk:  Strength and tone appear normal for age. No joint deformities or effusions. Extremities: No clubbing or cyanosis. No lower extremity edema.  Distal pedal pulses are 2+ bilaterally. Neuro: Alert and oriented X 3. Moves all extremities spontaneously. No focal deficits noted. Psych:  Responds to questions appropriately with a normal affect. Skin: No rashes or lesions noted  Wt Readings from Last 3 Encounters:  09/28/19 175 lb (79.4 kg)  09/25/19 170 lb (77.1 kg)  07/24/19 170 lb (77.1 kg)     Studies/Labs Reviewed:   EKG:  EKG is not ordered today.   Recent Labs: 10/26/2018: ALT 38 07/22/2019: Magnesium 2.0; TSH 1.579 09/25/2019: BUN 9; Creatinine, Ser 0.77; Hemoglobin 13.1; Platelets 332; Potassium 3.7; Sodium 141   Lipid Panel No results found for: CHOL, TRIG, HDL, CHOLHDL, VLDL, LDLCALC, LDLDIRECT  Additional studies/ records that were reviewed today include:   Event Monitor: 11/2018  21 day event monitor  Min HR 49, Max HR 166, Avg HR 82  Symptoms correlate with sinus rhythm and sinus tachycardia  No significant arrhythmias  Assessment:    1.  Palpitations   2. POTS (postural orthostatic tachycardia syndrome)      Plan:   In order of problems listed above:  1. Palpitations/POTS - She has palpitations with positional changes and was felt to be dehydrated during her most recent ED evaluation. She has been wearing compression stockings along with increasing her fluid intake and salt intake since her last visit in 07/2019. Orthostatics checked in the office again today and while sitting BP was stable at 124/64 with heart rate of 77 but with standing her heart rate increased to 104 bpm and BP remained stable at 124/64. - Reviewed options with the patient and she has applied all conservative measures recommended thus far. Will plan to continue short-acting Cardizem CD 30 mg twice daily and add Florinef 0.1 mg daily in the setting of POTS. She did just start Risperidone per Behavioral Health earlier this week and I recommended she hold off on starting Florinef until next week to make sure she does not have any reactions to the medications.    Medication Adjustments/Labs and Tests Ordered: Current medicines are reviewed at length with the patient today.  Concerns regarding medicines are outlined above.  Medication changes, Labs and Tests ordered today are listed in the Patient Instructions below. Patient Instructions  Postural Orthostatic Tachycardia Syndrome Postural orthostatic tachycardia syndrome (POTS) is a group of symptoms that occur when a person stands up after lying down. POTS occurs when less blood than normal flows to the body when you stand up. The reduced blood flow to the body makes the heart beat rapidly. POTS may be associated with another medical condition, or it may occur on its own. What are the causes? The cause of this condition is not known, but many conditions and diseases are associated with it. What increases the risk? This condition is more likely to develop in:  Women 49-17 years old.  Women who are  pregnant.  Women who are in their period (menstruating).  People who have certain conditions, such as: ? Infection from a virus. ? Attacks of healthy organs by the body's immunity (autoimmune disease). ? Losing a lot of red blood cells (anemia). ? Losing too much water in the body (dehydration). ? An overactive thyroid (hyperthyroidism).  People who take certain medicines.  People who have had a major injury.  People who have had surgery. What are the signs or symptoms?  The most common symptom of this condition is light-headedness when one stands from a lying or sitting position. Other symptoms may include:  Feeling a rapid increase in the heartbeat (tachycardia) within 10 minutes of standing up.  Fainting.  Weakness.  Confusion.  Trembling.  Shortness of breath.  Sweating or flushing.  Headache.  Chest pain.  Breathing that is deeper and faster than normal (hyperventilation).  Nausea.  Anxiety. Symptoms may be worse in the morning, and they may be relieved by lying down. How is this diagnosed? This condition is diagnosed based on:  Your symptoms.  Your medical history.  A physical exam.  Checking your heart rate when you are lying down and after you stand up.  Checking your blood pressure when you go from lying down to standing up.  Blood tests to measure hormones that change with blood pressure. The blood tests will be done when you are lying down and when you are standing up. You may have other tests to check for conditions or diseases that are associated with POTS. How is this treated? Treatment for this condition depends on how severe your symptoms are and whether you have any conditions or diseases that are associated with POTS. Treatment may involve:  Treating any conditions or diseases that are associated with POTS.  Drinking two glasses of water before getting up from a lying position.  Eating more salt (sodium).  Taking medicine to control  blood pressure and heart rate (beta-blocker).  Avoiding certain medicines.  Starting an exercise program under the supervision of a health care provider. Follow these instructions at home: Medicines  Take over-the-counter and prescription medicines only as told by your health care provider.  Let your health care provider know about all prescription or over-the-counter medicines. These include herbs, vitamins, and supplements. You may need to stop or adjust some medicines if they cause this condition.  Talk with your health care provider before starting any new medicines. Eating and drinking   Drink enough fluid to keep your urine pale yellow.  If told by your health care provider, drink two glasses of water before getting up from a lying position.  Follow instructions from your health care provider about how much sodium you should eat.  Avoid heavy meals. Eat several small meals a day instead of a few large meals. General instructions  Do an aerobic exercise for 20 minutes a day, at least 3 days a week.  Ask your health care provider what kinds of exercise are safe for you.  Do not use any products that contain nicotine or tobacco, such as cigarettes and e-cigarettes. These can interfere with blood flow. If you need help quitting, ask your health care provider.  Keep all follow-up visits as told by your health care provider. This is important. Contact a health care provider if:  Your symptoms do not improve after treatment.  Your symptoms get worse.  You develop new symptoms. Get help right away if:  You have chest pain.  You have difficulty breathing.  You have fainting episodes. These symptoms may represent a serious problem that is an emergency. Do not wait to see if the symptoms will go away. Get medical help right away. Call your local emergency services (911 in the [place] Do not drive yourself to the hospital. Summary  POTS is a condition that can cause  light-headedness, fainting, and palpitations when you go from a sitting or lying position to a standing position. It occurs when less blood than normal  flows to the body when you stand up.  Treatment for this condition includes treating any underlying conditions, drinking plenty of water, stopping or changing some medicines, or starting an exercise program.  Get help right away if you have chest pain, difficulty breathing, or fainting episodes. These may represent a serious problem that is an emergency. This information is not intended to replace advice given to you by your health care provider. Make sure you discuss any questions you have with your health care provider. Document Revised: 09/13/2017 Document Reviewed: 09/13/2017 Elsevier Patient Education  2020 [employer].      Medication Instructions: START Florinef 0.1 mg daily   Labwork: None  Procedures/Testing: None  Follow-Up: 2 months with Dr.Branch in office.   Any Additional Special Instructions Will Be Listed Below (If Applicable).        Thank you for choosing [facility] !        If you need a refill on your cardiac medications before your next appointment, please call your pharmacy.    Signed, Ellsworth Lennox, PA-C  09/28/2019 5:27 PM    [facility] [address] [place], [place] [address] Phone: [phone] Fax: [phone]

## 2019-09-28 ENCOUNTER — Ambulatory Visit (INDEPENDENT_AMBULATORY_CARE_PROVIDER_SITE_OTHER): Payer: 59 | Admitting: Student

## 2019-09-28 ENCOUNTER — Encounter: Payer: Self-pay | Admitting: Student

## 2019-09-28 VITALS — BP 132/87 | HR 92 | Temp 97.5°F | Ht 63.0 in | Wt 175.0 lb

## 2019-09-28 DIAGNOSIS — I498 Other specified cardiac arrhythmias: Secondary | ICD-10-CM

## 2019-09-28 DIAGNOSIS — G90A Postural orthostatic tachycardia syndrome (POTS): Secondary | ICD-10-CM

## 2019-09-28 DIAGNOSIS — R002 Palpitations: Secondary | ICD-10-CM

## 2019-09-28 MED ORDER — FLUDROCORTISONE ACETATE 0.1 MG PO TABS: 0.1000 mg | ORAL_TABLET | Freq: Every day | ORAL | 3 refills | Status: DC

## 2019-09-28 NOTE — Patient Instructions (Addendum)
Postural Orthostatic Tachycardia Syndrome Postural orthostatic tachycardia syndrome (POTS) is a group of symptoms that occur when a person stands up after lying down. POTS occurs when less blood than normal flows to the body when you stand up. The reduced blood flow to the body makes the heart beat rapidly. POTS may be associated with another medical condition, or it may occur on its own. What are the causes? The cause of this condition is not known, but many conditions and diseases are associated with it. What increases the risk? This condition is more likely to develop in:  Women 15-50 years old.  Women who are pregnant.  Women who are in their period (menstruating).  People who have certain conditions, such as: ? Infection from a virus. ? Attacks of healthy organs by the body's immunity (autoimmune disease). ? Losing a lot of red blood cells (anemia). ? Losing too much water in the body (dehydration). ? An overactive thyroid (hyperthyroidism).  People who take certain medicines.  People who have had a major injury.  People who have had surgery. What are the signs or symptoms? The most common symptom of this condition is light-headedness when one stands from a lying or sitting position. Other symptoms may include:  Feeling a rapid increase in the heartbeat (tachycardia) within 10 minutes of standing up.  Fainting.  Weakness.  Confusion.  Trembling.  Shortness of breath.  Sweating or flushing.  Headache.  Chest pain.  Breathing that is deeper and faster than normal (hyperventilation).  Nausea.  Anxiety. Symptoms may be worse in the morning, and they may be relieved by lying down. How is this diagnosed? This condition is diagnosed based on:  Your symptoms.  Your medical history.  A physical exam.  Checking your heart rate when you are lying down and after you stand up.  Checking your blood pressure when you go from lying down to standing up.  Blood  tests to measure hormones that change with blood pressure. The blood tests will be done when you are lying down and when you are standing up. You may have other tests to check for conditions or diseases that are associated with POTS. How is this treated? Treatment for this condition depends on how severe your symptoms are and whether you have any conditions or diseases that are associated with POTS. Treatment may involve:  Treating any conditions or diseases that are associated with POTS.  Drinking two glasses of water before getting up from a lying position.  Eating more salt (sodium).  Taking medicine to control blood pressure and heart rate (beta-blocker).  Avoiding certain medicines.  Starting an exercise program under the supervision of a health care provider. Follow these instructions at home: Medicines  Take over-the-counter and prescription medicines only as told by your health care provider.  Let your health care provider know about all prescription or over-the-counter medicines. These include herbs, vitamins, and supplements. You may need to stop or adjust some medicines if they cause this condition.  Talk with your health care provider before starting any new medicines. Eating and drinking   Drink enough fluid to keep your urine pale yellow.  If told by your health care provider, drink two glasses of water before getting up from a lying position.  Follow instructions from your health care provider about how much sodium you should eat.  Avoid heavy meals. Eat several small meals a day instead of a few large meals. General instructions  Do an aerobic exercise for 20 minutes   a day, at least 3 days a week.  Ask your health care provider what kinds of exercise are safe for you.  Do not use any products that contain nicotine or tobacco, such as cigarettes and e-cigarettes. These can interfere with blood flow. If you need help quitting, ask your health care  provider.  Keep all follow-up visits as told by your health care provider. This is important. Contact a health care provider if:  Your symptoms do not improve after treatment.  Your symptoms get worse.  You develop new symptoms. Get help right away if:  You have chest pain.  You have difficulty breathing.  You have fainting episodes. These symptoms may represent a serious problem that is an emergency. Do not wait to see if the symptoms will go away. Get medical help right away. Call your local emergency services (911 in the [place] Do not drive yourself to the hospital. Summary  POTS is a condition that can cause light-headedness, fainting, and palpitations when you go from a sitting or lying position to a standing position. It occurs when less blood than normal flows to the body when you stand up.  Treatment for this condition includes treating any underlying conditions, drinking plenty of water, stopping or changing some medicines, or starting an exercise program.  Get help right away if you have chest pain, difficulty breathing, or fainting episodes. These may represent a serious problem that is an emergency. This information is not intended to replace advice given to you by your health care provider. Make sure you discuss any questions you have with your health care provider. Document Revised: 09/13/2017 Document Reviewed: 09/13/2017 Elsevier Patient Education  2020 [employer].      Medication Instructions: START Florinef 0.1 mg daily   Labwork: None  Procedures/Testing: None  Follow-Up: 2 months with Dr.Branch in office.   Any Additional Special Instructions Will Be Listed Below (If Applicable).        Thank you for choosing [facility] !        If you need a refill on your cardiac medications before your next appointment, please call your pharmacy.

## 2019-09-28 NOTE — Telephone Encounter (Signed)
Pt was seen in ED on 09/25/19. Dr. Sherron Monday with Dr. Wyline Mood and pt was started on diltiazem.

## 2019-10-09 ENCOUNTER — Ambulatory Visit: Payer: BC Managed Care – PPO | Admitting: Cardiology

## 2019-10-10 ENCOUNTER — Other Ambulatory Visit: Payer: Self-pay | Admitting: Student

## 2019-10-10 MED ORDER — FLUDROCORTISONE ACETATE 0.1 MG PO TABS: 0.1000 mg | ORAL_TABLET | Freq: Every day | ORAL | 1 refills | Status: DC

## 2019-10-10 NOTE — Telephone Encounter (Signed)
Florinef  refilled

## 2019-12-03 ENCOUNTER — Ambulatory Visit: Payer: Self-pay | Admitting: Cardiology

## 2019-12-03 NOTE — Progress Notes (Deleted)
Clinical Summary [name] is a 24 y.o.female seen today for follow up fo the following medical problems.    1. Palpitations/POTs - ER visit 10/2018 with palpitations - WBC 13.6 Hgb 13.8 Plt 348 K 3.3 TSH 4.7 serum preg neg CXR no acute process EKG NSR. She reports she has copies of EMS strips that apparently showed the elevated hearts, EKGs by ER in epic was normal  -symptoms started about 1 year ago. Increase in frequency. Feeling of heart shaking, gets fatigued, and dizzy. Chest tightness, some SOB. Occurs at anytime. Episodes usually last 1 min up to 5 minutes. - episodes vary in frequency - apple watch episodesas fast as190s at times.     - 11/2018 event monitor without significant arrhythmias - started on lorpessor 12.5mg  bid.  - has noticed some low heart rates at times on lopressor. Lopressor can cause significant fatigue.   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 Back pain   . Chlamydia infection 01/03/2017  . Neck pain   . Shoulder pain, right    frozen shoulder  . SVT (supraventricular tachycardia) (HCC)      Allergies  Allergen Reactions  . Bee Venom Swelling  . Codeine Nausea And Vomiting, Rash and Other (See Comments)    Patient had a reaction to codeine cough syrup; caused hallucinations and vomiting     Current Outpatient Medications  Medication Sig Dispense Refill  . diltiazem (CARDIZEM) 30 MG tablet TAKE 1 TABLET TWICE DAILY AS NEEDED (Patient taking differently: Take 30 mg by mouth 2 (two) times daily as needed. TAKE 1 TABLET TWICE DAILY AS NEEDED) 180 tablet 3  . fludrocortisone (FLORINEF) 0.1 MG tablet Take 1 tablet (0.1 mg total) by mouth daily. 90 tablet 1  . hydrOXYzine (ATARAX/VISTARIL) 25 MG tablet Take 2 tablets by mouth 3 (three) times daily as  needed.    . Levonorgestrel (KYLEENA) 19.5 MG IUD 1 each by Intrauterine route once.     . risperiDONE (RISPERDAL) 3 MG tablet Take 3 mg by mouth at bedtime.     No current facility-administered medications for this visit.     Past Surgical History:  Procedure Laterality Date  . NO PAST SURGERIES    . SHOULDER SURGERY       Allergies  Allergen Reactions  . Bee Venom Swelling  . Codeine Nausea And Vomiting, Rash and Other (See Comments)    Patient had a reaction to codeine cough syrup; caused hallucinations and vomiting      Family History  Problem Relation Age of Onset  . Cancer Paternal Grandfather        prostate  . Hypertension Paternal Grandfather   . Hypertension Paternal Grandmother   . Hypertension Father   . Ovarian cysts Mother   . Ovarian cancer Other      Social History Bethany Navarro reports that she quit smoking about 3 months ago. Her smoking use included cigarettes. She has a 2.50 pack-year smoking history. She uses smokeless tobacco. Bethany Navarro reports previous alcohol use.   Review of Systems CONSTITUTIONAL: No weight loss, fever, chills, weakness or fatigue.  HEENT: Eyes: No visual loss, blurred vision, double vision or yellow sclerae.No hearing loss, sneezing, congestion, runny nose or sore throat.  SKIN: No rash or itching.  CARDIOVASCULAR:  RESPIRATORY:  No shortness of breath, cough or sputum.  GASTROINTESTINAL: No anorexia, nausea, vomiting or diarrhea. No abdominal pain or blood.  GENITOURINARY: No burning on urination, no polyuria NEUROLOGICAL: No headache, dizziness, syncope, paralysis, ataxia, numbness or tingling in the extremities. No change in bowel or bladder control.  MUSCULOSKELETAL: No muscle, back pain, joint pain or stiffness.  LYMPHATICS: No enlarged nodes. No history of splenectomy.  PSYCHIATRIC: No history of depression or anxiety.  ENDOCRINOLOGIC: No reports of sweating, cold or heat intolerance. No polyuria or polydipsia.   Marland Kitchen   Physical Examination There were no vitals filed for this visit. There were no vitals filed for this visit.  Gen: resting comfortably, no acute distress HEENT: no scleral icterus, pupils equal round and reactive, no palptable cervical adenopathy,  CV Resp: Clear to auscultation bilaterally GI: abdomen is soft, non-tender, non-distended, normal bowel sounds, no hepatosplenomegaly MSK: extremities are warm, no edema.  Skin: warm, no rash Neuro:  no focal deficits Psych: appropriate affect   Diagnostic Studies 11/2018 event monitor  21 day event monitor  Min HR 49, Max HR 166, Avg HR 82  Symptoms correlate with sinus rhythm and sinus tachycardia  No significant arrhythmias    Assessment and Plan  1. Palpitations - Recent benign monitor - side effects on lopressor, will try diltiazem 30mg  prn for symptoms to see if better tolerated   2. Hyperlipidemia (self reported history) - she reports being told as a teenager she had high cholesterol, has not had recent labs as no pcp - we will recheck a cholesterol  3. Elevated TSH - mildly elevated during ER visit, we will repeat lab.       Bethany Navarro, M.D., F.A.C.C.

## 2020-03-04 ENCOUNTER — Encounter: Payer: Self-pay | Admitting: Gastroenterology

## 2020-03-19 ENCOUNTER — Other Ambulatory Visit: Payer: Self-pay | Admitting: Student

## 2020-03-19 NOTE — Telephone Encounter (Signed)
This is a [place] pt, Dr. Branch 

## 2020-04-15 NOTE — Progress Notes (Signed)
Cardiology Office Note    Date:  04/16/2020   ID:  Bethany Navarro, DOB 04/10/1996, MRN [id number]  PCP:  Rebekah Chesterfield, NP  Cardiologist: Dina Rich, MD    Chief Complaint  Patient presents with  . Follow-up    6 month visit    History of Present Illness:    Bethany Navarro is a 24 y.o. female with past medical history of POTS, palpitations and tobacco use who presents to the office today for 58-month follow-up.  She was last examined by myself in 09/2019 following a recent ED evaluation for palpitations and dizziness. She was found to be orthostatic and her heart rate increased from the 70's to over 100 bpm with standing. It was felt that her symptoms could be secondary to POTS. At the time of her visit, she reported still having issues with palpitations and dizziness despite consuming over 3 L of fluid on a daily basis. She was continued on short-acting Cardizem 30 mg twice daily and was started on Florinef 0.1 mg daily.  In talking with the patient today, she reports having episodes of nausea and vomiting earlier this summer where she was unable to keep any solid foods down and was diagnosed with IBS by her PCP. She has been referred to GI and has a new patient appointment next week. She continues to have episodes of feeling like food is stuck in her throat but this occurs sporadically.  She does continue to experience intermittent palpitations but has not had to utilize as needed Cardizem recently. She reports good compliance with Florinef and has overall noted significant improvement in her symptoms with this. 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    Past Medical History:  Diagnosis Date  . Asthma   . Back pain   . Bipolar 1 disorder (HCC)    . Chlamydia infection 01/03/2017  . IBS (irritable bowel syndrome)   . Neck pain   . Palpitations   . POTS (postural orthostatic tachycardia syndrome)   . Shoulder pain, right    frozen shoulder  . SVT (supraventricular tachycardia) (HCC)   . Thoracic outlet syndrome     Past Surgical History:  Procedure Laterality Date  . 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 SHOULDER SURGERY      Current Medications: Outpatient Medications Prior to Visit  Medication Sig Dispense Refill  . hydrOXYzine (ATARAX/VISTARIL) 25 MG tablet Take 2 tablets by mouth 3 (three) times daily as needed.    . Levonorgestrel (KYLEENA) 19.5 MG IUD 1 each by Intrauterine route once.     . OMEPRAZOLE PO Take by mouth.    . Plecanatide 3 MG TABS Take by mouth.    Leafy Kindle capsule Take 3 mg by mouth daily.    . fludrocortisone (FLORINEF) 0.1 MG tablet Take 1 tablet by mouth once daily 30 tablet 6  . risperiDONE (RISPERDAL) 3 MG tablet Take 3 mg by mouth at bedtime.    Marland Kitchen diltiazem (CARDIZEM) 30 MG tablet TAKE 1 TABLET TWICE DAILY AS NEEDED (Patient taking differently: Take 30 mg by mouth 2 (two) times daily as needed. TAKE 1 TABLET TWICE DAILY AS NEEDED) 180 tablet 3   No facility-administered  medications prior to visit.     Allergies:   Bee venom and Codeine   Social History   Socioeconomic History  . Marital status: Single    Spouse name: Not on file  . Number of children: Not on file  . Years of education: Not on file  . Highest education level: Not on file  Occupational History  . Not on file  Tobacco Use  . Smoking status: Current Every Day Smoker    Packs/day: 0.25    Years: 5.00    Pack years: 1.25    Types: Cigarettes  . Smokeless tobacco: Current User  Vaping Use  . Vaping Use: Never used  Substance and Sexual Activity  . Alcohol use: Yes    Comment: Occ   . Drug use: Never  . Sexual activity: Yes    Birth control/protection: I.U.D.  Other Topics Concern  .  Not on file  Social History Narrative   ** Merged History Encounter **       Social Determinants of Health   Financial Resource Strain:   . Difficulty of Paying Living Expenses: Not on file  Food Insecurity:   . Worried About Programme researcher, broadcasting/film/video in the Last Year: Not on file  . Ran Out of Food in the Last Year: Not on file  Transportation Needs:   . Lack of Transportation (Medical): Not on file  . Lack of Transportation (Non-Medical): Not on file  Physical Activity:   . Days of Exercise per Week: Not on file  . Minutes of Exercise per Session: Not on file  Stress:   . Feeling of Stress : Not on file  Social Connections:   . Frequency of Communication with Friends and Family: Not on file  . Frequency of Social Gatherings with Friends and Family: Not on file  . Attends Religious Services: Not on file  . Active Member of Clubs or Organizations: Not on file  . Attends Banker Meetings: Not on file  . Marital Status: Not on file     Family History:  The patient's family history includes Hypertension in her father, paternal grandfather, and paternal grandmother; Ovarian cancer in an other family member; Ovarian cysts in her mother; Prostate cancer in her paternal grandfather.   Review of Systems:   Please see the history of present illness.     General:  No chills, fever, night sweats or weight changes.  Cardiovascular:  No chest pain, dyspnea on exertion, edema, orthopnea,  paroxysmal nocturnal dyspnea. Positive for palpitations.  Dermatological: No rash, lesions/masses Respiratory: No cough, dyspnea Urologic: No hematuria, dysuria Abdominal:   No diarrhea, bright red blood per rectum, melena, or hematemesis. Positive for nausea and vomiting (improved).  Neurologic:  No visual changes, wkns, changes in mental status.  All other systems reviewed and are otherwise negative except as noted above.   Physical Exam:    VS:  BP 116/60   Pulse 74   Ht 5\' 3"  (1.6 m)    Wt 175 lb 12.8 oz (79.7 kg)   SpO2 98%   BMI 31.14 kg/m    General: Well developed, well nourished,female appearing in no acute distress. Head: Normocephalic, atraumatic. Neck: No carotid bruits. JVD not elevated.  Lungs: Respirations regular and unlabored, without wheezes or rales.  Heart: Regular rate and rhythm. No S3 or S4.  No murmur, no rubs, or gallops appreciated. Abdomen: Appears non-distended. No obvious abdominal masses. Msk:  Strength and tone appear normal for age. No obvious  joint deformities or effusions. Extremities: No clubbing or cyanosis. No lower extremity edema.  Distal pedal pulses are 2+ bilaterally. Neuro: Alert and oriented X 3. Moves all extremities spontaneously. No focal deficits noted. Psych:  Responds to questions appropriately with a normal affect. Skin: No rashes or lesions noted  Wt Readings from Last 3 Encounters:  04/16/20 175 lb 12.8 oz (79.7 kg)  09/28/19 175 lb (79.4 kg)  09/25/19 170 lb (77.1 kg)     Studies/Labs Reviewed:   EKG:  EKG is not ordered today.    Recent Labs: 07/22/2019: Magnesium 2.0; TSH 1.579 09/25/2019: BUN 9; Creatinine, Ser 0.77; Hemoglobin 13.1; Platelets 332; Potassium 3.7; Sodium 141   Lipid Panel No results found for: CHOL, TRIG, HDL, CHOLHDL, VLDL, LDLCALC, LDLDIRECT  Additional studies/ records that were reviewed today include:   Event Monitor: 11/2018  21 day event monitor  Min HR 49, Max HR 166, Avg HR 82  Symptoms correlate with sinus rhythm and sinus tachycardia  No significant arrhythmias   Assessment:    1. POTS (postural orthostatic tachycardia syndrome)   2. Palpitations   3. Nausea and vomiting, intractability of vomiting not specified, unspecified vomiting type      Plan:   In order of problems listed above:  1. POTS/Palpitations - She was diagnosed with POTS earlier this year and has experienced improvement in her dizziness and palpitations since being started on Florinef 0.1 mg daily.  She was previously intolerant to beta-blocker therapy and does have a prescription for as needed Cardizem 30 mg but has not had to utilize this recently. She continues to utilize conservative treatments including the use of compression stockings and consuming a significant amount of fluid (over 3L daily). Given improvement of her symptoms, will continue Florinef at her current dosing for now. We reviewed that this could be further titrated in the future if symptoms progress.  2. Nausea/ Vomiting/Intermittent Solid Food Dysphagia - She does report intermittent symptoms over the past few months and was diagnosed with IBS by her PCP. She has scheduled follow-up with GI next week and was encouraged to keep this appointment.   Medication Adjustments/Labs and Tests Ordered: Current medicines are reviewed at length with the patient today.  Concerns regarding medicines are outlined above.  Medication changes, Labs and Tests ordered today are listed in the Patient Instructions below. Patient Instructions  Medication Instructions:  Your physician recommends that you continue on your current medications as directed. Please refer to the Current Medication list given to you today.  *If you need a refill on your cardiac medications before your next appointment, please call your pharmacy*   Lab Work: None today If you have labs (blood work) drawn today and your tests are completely normal, you will receive your results only by: Marland Kitchen. MyChart Message (if you have MyChart) OR . A paper copy in the mail If you have any lab test that is abnormal or we need to change your treatment, we will call you to review the results.   Testing/Procedures: None today   Follow-Up: At [facility], you and your health needs are our priority.  As part of our continuing mission to provide you with exceptional heart care, we have created designated Provider Care Teams.  These Care Teams include your primary Cardiologist  (physician) and Advanced Practice Providers (APPs -  Physician Assistants and Nurse Practitioners) who all work together to provide you with the care you need, when you need it.  We recommend signing up for  the patient portal called "MyChart".  Sign up information is provided on this After Visit Summary.  MyChart is used to connect with patients for Virtual Visits (Telemedicine).  Patients are able to view lab/test results, encounter notes, upcoming appointments, etc.  Non-urgent messages can be sent to your provider as well.   To learn more about what you can do with MyChart, go to [email or web address].    Your next appointment:   6 month(s)  The format for your next appointment:   In Person  Provider:   Dina Rich, MD or Randall An, PA-C   Other Instructions None    Thank you for choosing [facility] !           Signed, Ellsworth Lennox, PA-C  04/16/2020 4:50 PM    [facility] [address] [place], [place] [address] Phone: [phone] Fax: [phone]

## 2020-04-16 ENCOUNTER — Ambulatory Visit (INDEPENDENT_AMBULATORY_CARE_PROVIDER_SITE_OTHER): Payer: 59 | Admitting: Student

## 2020-04-16 ENCOUNTER — Encounter: Payer: Self-pay | Admitting: Student

## 2020-04-16 ENCOUNTER — Other Ambulatory Visit: Payer: Self-pay

## 2020-04-16 VITALS — BP 116/60 | HR 74 | Ht 63.0 in | Wt 175.8 lb

## 2020-04-16 DIAGNOSIS — R002 Palpitations: Secondary | ICD-10-CM

## 2020-04-16 DIAGNOSIS — I498 Other specified cardiac arrhythmias: Secondary | ICD-10-CM

## 2020-04-16 DIAGNOSIS — R112 Nausea with vomiting, unspecified: Secondary | ICD-10-CM

## 2020-04-16 DIAGNOSIS — G90A Postural orthostatic tachycardia syndrome (POTS): Secondary | ICD-10-CM

## 2020-04-16 MED ORDER — FLUDROCORTISONE ACETATE 0.1 MG PO TABS: 100.0000 ug | ORAL_TABLET | Freq: Every day | ORAL | 3 refills | Status: AC

## 2020-04-16 NOTE — Patient Instructions (Signed)
Medication Instructions:  Your physician recommends that you continue on your current medications as directed. Please refer to the Current Medication list given to you today.  *If you need a refill on your cardiac medications before your next appointment, please call your pharmacy*   Lab Work: None today If you have labs (blood work) drawn today and your tests are completely normal, you will receive your results only by: Marland Kitchen MyChart Message (if you have MyChart) OR . A paper copy in the mail If you have any lab test that is abnormal or we need to change your treatment, we will call you to review the results.   Testing/Procedures: None today   Follow-Up: At [facility], you and your health needs are our priority.  As part of our continuing mission to provide you with exceptional heart care, we have created designated Provider Care Teams.  These Care Teams include your primary Cardiologist (physician) and Advanced Practice Providers (APPs -  Physician Assistants and Nurse Practitioners) who all work together to provide you with the care you need, when you need it.  We recommend signing up for the patient portal called "MyChart".  Sign up information is provided on this After Visit Summary.  MyChart is used to connect with patients for Virtual Visits (Telemedicine).  Patients are able to view lab/test results, encounter notes, upcoming appointments, etc.  Non-urgent messages can be sent to your provider as well.   To learn more about what you can do with MyChart, go to [email or web address].    Your next appointment:   6 month(s)  The format for your next appointment:   In Person  Provider:   Dina Rich, MD or Randall An, PA-C   Other Instructions None       Thank you for choosing [facility] !

## 2020-04-22 ENCOUNTER — Encounter: Payer: Self-pay | Admitting: Gastroenterology

## 2020-04-22 ENCOUNTER — Ambulatory Visit (INDEPENDENT_AMBULATORY_CARE_PROVIDER_SITE_OTHER): Payer: 59 | Admitting: Gastroenterology

## 2020-04-22 VITALS — BP 112/66 | HR 72 | Ht 63.25 in | Wt 181.5 lb

## 2020-04-22 DIAGNOSIS — R1310 Dysphagia, unspecified: Secondary | ICD-10-CM | POA: Diagnosis not present

## 2020-04-22 DIAGNOSIS — K219 Gastro-esophageal reflux disease without esophagitis: Secondary | ICD-10-CM

## 2020-04-22 DIAGNOSIS — K5909 Other constipation: Secondary | ICD-10-CM | POA: Diagnosis not present

## 2020-04-22 DIAGNOSIS — R1012 Left upper quadrant pain: Secondary | ICD-10-CM

## 2020-04-22 DIAGNOSIS — R1319 Other dysphagia: Secondary | ICD-10-CM

## 2020-04-22 MED ORDER — METOCLOPRAMIDE HCL 5 MG PO TABS: 5.0000 mg | ORAL_TABLET | Freq: Two times a day (BID) | ORAL | 0 refills | Status: AC | PRN

## 2020-04-22 MED ORDER — PLENVU 140 G PO SOLR: 140.0000 g | ORAL | 0 refills | Status: DC

## 2020-04-22 NOTE — Patient Instructions (Addendum)
If you are age 23 or older, your body mass index should be between 23-30. Your Body mass index is 31.9 kg/m. If this is out of the aforementioned range listed, please consider follow up with your Primary Care Provider.  If you are age 34 or younger, your body mass index should be between 19-25. Your Body mass index is 31.9 kg/m. If this is out of the aformentioned range listed, please consider follow up with your Primary Care Provider.    You have been scheduled for an endoscopy and colonoscopy. Please follow the written instructions given to you at your visit today. Please pick up your prep supplies at the pharmacy within the next 1-3 days. If you use inhalers (even only as needed), please bring them with you on the day of your procedure.  It was a pleasure to see you today!  Dr. Myrtie Neither

## 2020-04-22 NOTE — Progress Notes (Signed)
[facility] Consult Note:  History: Bethany Navarro 04/22/2020  Referring provider: Rebekah Chesterfield, NP  Reason for consult/chief complaint: Gastroesophageal Reflux (better since taking Omeprazole), Anemia, Irritable Bowel Syndrome (have left side abd swelling), and Abdominal Pain (LUQ abd pain when eating)   Subjective  HPI:  This is a very pleasant 24 year old woman accompanied by her grandmother and referred by primary care for multiple digestive symptoms.  She reports constipation since middle school as well as heartburn and regurgitation.  She became acutely sick in mid June of this year with vomiting that lasted nearly 2 weeks and worsening of constipation.  The vomiting finally stopped, and her heartburn improved after PCP started omeprazole.  However, she has had postprandial left upper quadrant and left lower quadrant crampy pain and visible distention.  If she stands up when this occurs, she can see an asymmetry of the abdominal wall from the distention.  The constipation is now much better on Trulance once daily, and she is having 3 or 4 BMs per day, sometimes loose.  Duwayne Heck believes she had a single episode of rectal bleeding sometime in the last couple of years. In the last few months she is also bothered by a feeling of food either feeling stuck or perhaps a tightness or fullness in the upper chest when eating.   ROS:  Review of Systems  Constitutional: Negative for appetite change and unexpected weight change.  HENT: Negative for mouth sores and voice change.   Eyes: Negative for pain and redness.  Respiratory: Negative for cough and shortness of breath.   Cardiovascular: Negative for chest pain and palpitations.       Episodic hypotension with tachycardia from her POTS  Genitourinary: Negative for dysuria and hematuria.  Musculoskeletal: Negative for arthralgias and myalgias.  Skin: Negative for pallor and rash.  Neurological: Negative for  weakness and headaches.  Hematological: Negative for adenopathy.     Past Medical History: Past Medical History:  Diagnosis Date  . Anemia   . Asthma   . Back pain   . Bipolar 1 disorder (HCC)   . Chlamydia infection 01/03/2017  . IBS (irritable bowel syndrome)   . Neck pain   . Palpitations   . POTS (postural orthostatic tachycardia syndrome)   . Shoulder pain, right    frozen shoulder  . SVT (supraventricular tachycardia) (HCC)   . Thoracic outlet syndrome    I reviewed the cardiology office note from last week after Danielle's visit to the ED for a POTS episode  Past Surgical History: Past Surgical History:  Procedure Laterality Date  . 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 SHOULDER SURGERY Right    Thoracic outlet     Family History: Family History  Problem Relation Age of Onset  . Hypertension Paternal Grandfather   . Prostate cancer Paternal Grandfather        mets to bone  . Hypertension Paternal Grandmother   . Liver cancer Paternal Grandmother   . Atrial fibrillation Paternal Grandmother   . Irritable bowel syndrome Maternal Grandfather   . Diverticulitis Maternal Grandfather   . Hypertension Father   . Hyperlipidemia Father   . Ovarian cysts Mother   . Ovarian cancer Mother   . Skin cancer Mother   . Irritable bowel syndrome Mother   . Diverticulitis Mother   . Ovarian cancer Other   . Throat cancer Maternal Uncle     Social History: Social History  Socioeconomic History  . Marital status: Single    Spouse name: Not on file  . Number of children: 0  . Years of education: Not on file  . Highest education level: Not on file  Occupational History  . 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 Smoking status: Current Every Day Smoker    Packs/day: 0.25    Years: 5.00    Pack years: 1.25    Types: Cigarettes  . Smokeless tobacco: Never Used  Vaping Use  . Vaping Use: Never used  Substance and Sexual Activity  . Alcohol  use: Yes    Comment: Occ   . Drug use: Never  . Sexual activity: Yes    Birth control/protection: I.U.D.  Other Topics Concern  . Not on file  Social History Narrative   ** Merged History Encounter **       Social Determinants of Health   Financial Resource Strain:   . Difficulty of Paying Living Expenses: Not on file  Food Insecurity:   . Worried About Programme researcher, broadcasting/film/video in the Last Year: Not on file  . Ran Out of Food in the Last Year: Not on file  Transportation Needs:   . Lack of Transportation (Medical): Not on file  . Lack of Transportation (Non-Medical): Not on file  Physical Activity:   . Days of Exercise per Week: Not on file  . Minutes of Exercise per Session: Not on file  Stress:   . Feeling of Stress : Not on file  Social Connections:   . Frequency of Communication with Friends and Family: Not on file  . Frequency of Social Gatherings with Friends and Family: Not on file  . Attends Religious Services: Not on file  . Active Member of Clubs or Organizations: Not on file  . Attends Banker Meetings: Not on file  . 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 Bee Venom Swelling  . Codeine Nausea And Vomiting, Rash and Other (See Comments)    Patient had a reaction to codeine cough syrup; caused hallucinations and vomiting    Outpatient Meds: Current Outpatient Medications  Medication Sig Dispense Refill  . fludrocortisone (FLORINEF) 0.1 MG tablet Take 1 tablet (100 mcg total) by mouth daily. 90 tablet 3  . hydrOXYzine (ATARAX/VISTARIL) 25 MG tablet Take 2 tablets by mouth 3 (three) times daily as needed.    . Levonorgestrel (KYLEENA) 19.5 MG IUD 1 each by Intrauterine route once.     Marland Kitchen omeprazole (PRILOSEC) 20 MG capsule Take 20 mg by mouth every morning.    Marland Kitchen Plecanatide 3 MG TABS Take by mouth.    Leafy Kindle capsule Take 3 mg by mouth daily.    . metoCLOPramide (REGLAN) 5 MG tablet Take 1 tablet (5  mg total) by mouth every 12 (twelve) hours as needed for up to 2 doses for nausea. Take 30-45 minutes before evening and AM doses of bowel preparation solution. 2 tablet 0  . PEG-KCl-NaCl-NaSulf-Na Asc-C (PLENVU) 140 g SOLR Take 140 g by mouth as directed. Manufacturer's coupon Universal coupon code:BIN: G6837245; GROUP: [id number]; PCN: [id number]; ID: [id number]; PAY NO MORE $50 1 each 0   No current facility-administered medications for this visit.      ___________________________________________________________________ Objective   Exam:  BP 112/66 (BP Location: Left Arm, Patient Position: Sitting, Cuff Size: Normal)   Pulse 72   Ht 5' 3.25" (1.607 m) Comment: height measured without shoes  Wt 181 lb 8 oz (82.3 kg)   LMP 04/01/2020   BMI 31.90 kg/m  Grandmother present for entire visit  General: Well-appearing, pleasant and conversational  Eyes: sclera anicteric, no redness  ENT: oral mucosa moist without lesions, no cervical or supraclavicular lymphadenopathy  CV: RRR without murmur, S1/S2, no JVD, no peripheral edema  Resp: clear to auscultation bilaterally, normal RR and effort noted  GI: soft, mild LUQ and LLQ tenderness, with active bowel sounds. No guarding or palpable organomegaly noted.  Skin; warm and dry, no rash or jaundice noted  Neuro: awake, alert and oriented x 3. Normal gross motor function and fluent speech  Labs:  CBC Latest Ref Rng & Units 09/25/2019 07/22/2019 10/26/2018  WBC 4.0 - 10.5 K/uL 8.6 8.2 13.6(H)  Hemoglobin 12.0 - 15.0 g/dL 40.9 81.1 91.4  Hematocrit 36 - 46 % 40.8 42.3 42.5  Platelets 150 - 400 K/uL 332 327 348   CMP Latest Ref Rng & Units 09/25/2019 07/22/2019 10/26/2018  Glucose 70 - 99 mg/dL 782(N) 89 94  BUN 6 - 20 mg/dL 9 6 9   Creatinine 0.44 - 1.00 mg/dL 5.62 1.30  Sodium 135 - 145 mmol/L 141 140 136  Potassium 3.5 - 5.1 mmol/L 3.7 3.5 3.3(L)  Chloride 98 - 111 mmol/L 103 107 105  CO2 22 - 32 mmol/L 28 23 22   Calcium 8.9 - 10.3  mg/dL 9.0 9.4 9.2  Total Protein 6.5 - 8.1 g/dL - - 7.4  Total Bilirubin 0.3 - 1.2 mg/dL - - 0.5  Alkaline Phos 38 - 126 U/L - - 61  AST 15 - 41 U/L - - 30  ALT 0 - 44 U/L - - 38     Radiologic Studies:  No abdominal imaging on file  Assessment: Encounter Diagnoses  Name Primary?  . LUQ pain Yes  . Chronic constipation   . Gastroesophageal reflux disease, unspecified whether esophagitis present   . Esophageal dysphagia     Chronic constipation and heartburn, then acute illness in mid June with vomiting.  The vomiting has resolved, but she has persistent left-sided abdominal crampy pain and distention, more so after meals. She probably has IBS for many years, less likely PFD.  I suspect this may have been exacerbated by an acute infectious illness in June. No diarrhea to suggest IBD.  Atypical symptoms for celiac.  Chronic constipation raises question of SIBO. Dysphagia or possibly globus sensation in the setting of chronic reflux. Plan:  EGD and colonoscopy.  She is agreeable after discussion of procedure and risks.  The benefits and risks of the planned procedure were described in detail with the patient or (when appropriate) their health care proxy.  Risks were outlined as including, but not limited to, bleeding, infection, perforation, adverse medication reaction leading to cardiac or pulmonary decompensation, pancreatitis (if ERCP).  The limitation of incomplete mucosal visualization was also discussed.  No guarantees or warranties were given.  Afterwards, consider anorectal manometry and perhaps change from Trulance to Motegrity or Zelnorm.  Thank you for the courtesy of this consult.  Please call me with any questions or concerns.  July III  CC: Referring provider noted above

## 2020-04-24 ENCOUNTER — Encounter: Payer: Self-pay | Admitting: Gastroenterology

## 2020-04-28 ENCOUNTER — Telehealth: Payer: Self-pay | Admitting: Gastroenterology

## 2020-04-29 ENCOUNTER — Other Ambulatory Visit: Payer: Self-pay

## 2020-04-29 MED ORDER — PLENVU 140 G PO SOLR: 140.0000 g | ORAL | 0 refills | Status: DC

## 2020-04-29 NOTE — Telephone Encounter (Signed)
Rx has been resent 

## 2020-04-30 ENCOUNTER — Telehealth: Payer: Self-pay | Admitting: *Deleted

## 2020-04-30 NOTE — Telephone Encounter (Signed)
Spoke with Missy at [facility]- covid test results not in Epic- Missy states results for covid test negative

## 2020-05-01 ENCOUNTER — Ambulatory Visit (AMBULATORY_SURGERY_CENTER): Payer: 59 | Admitting: Gastroenterology

## 2020-05-01 ENCOUNTER — Other Ambulatory Visit: Payer: Self-pay

## 2020-05-01 ENCOUNTER — Encounter: Payer: Self-pay | Admitting: Gastroenterology

## 2020-05-01 VITALS — BP 110/65 | HR 75 | Temp 97.5°F | Resp 17 | Ht 63.25 in | Wt 181.0 lb

## 2020-05-01 DIAGNOSIS — K5909 Other constipation: Secondary | ICD-10-CM | POA: Diagnosis not present

## 2020-05-01 DIAGNOSIS — K219 Gastro-esophageal reflux disease without esophagitis: Secondary | ICD-10-CM

## 2020-05-01 DIAGNOSIS — R1319 Other dysphagia: Secondary | ICD-10-CM

## 2020-05-01 DIAGNOSIS — R1012 Left upper quadrant pain: Secondary | ICD-10-CM

## 2020-05-01 MED ORDER — SODIUM CHLORIDE 0.9 % IV SOLN: 500.0000 mL | Freq: Once | INTRAVENOUS | Status: DC

## 2020-05-01 NOTE — Progress Notes (Signed)
[id number] 

## 2020-05-01 NOTE — Op Note (Signed)
[facility] Patient Name: Bethany Navarro Procedure Date: 05/01/2020 2:47 PM MRN: [id number] Endoscopist: Sherilyn Cooter L. Myrtie Neither , MD Age: 24 Referring MD:  Date of Birth: Jan 21, 1996 Gender: Female Account #: [id number] Procedure:                Upper GI endoscopy Indications:              Esophageal reflux symptoms that persist despite                            appropriate therapy, Globus sensation Medicines:                Monitored Anesthesia Care Procedure:                Pre-Anesthesia Assessment:                           - Prior to the procedure, a History and Physical                            was performed, and patient medications and                            allergies were reviewed. The patient's tolerance of                            previous anesthesia was also reviewed. The risks                            and benefits of the procedure and the sedation                            options and risks were discussed with the patient.                            All questions were answered, and informed consent                            was obtained. Prior Anticoagulants: The patient has                            taken no previous anticoagulant or antiplatelet                            agents. ASA Grade Assessment: II - A patient with                            mild systemic disease. After reviewing the risks                            and benefits, the patient was deemed in                            satisfactory condition to undergo the procedure.  After obtaining informed consent, the endoscope was                            passed under direct vision. Throughout the                            procedure, the patient's blood pressure, pulse, and                            oxygen saturations were monitored continuously. The                            Endoscope was introduced through the mouth, and                            advanced to the  second part of duodenum. The upper                            GI endoscopy was accomplished without difficulty.                            The patient tolerated the procedure well. Scope In: Scope Out: Findings:                 The esophagus was normal.                           The stomach was normal.                           The cardia and gastric fundus were normal on                            retroflexion.                           The examined duodenum was normal. Complications:            No immediate complications. Estimated Blood Loss:     Estimated blood loss: none. Impression:               - Normal esophagus.                           - Normal stomach.                           - Normal examined duodenum.                           - No specimens collected.                           Throat discomfort and sensation of dysphagia from                            non-erosive reflux. Recommendation:           - Patient  has a contact number available for                            emergencies. The signs and symptoms of potential                            delayed complications were discussed with the                            patient. Return to normal activities tomorrow.                            Written discharge instructions were provided to the                            patient.                           - Resume previous diet.                           - Continue present medications.                           - Follow an antireflux regimen.                           - See the other procedure note for documentation of                            additional recommendations. Bethany Navarro L. Myrtie Neither, MD 05/01/2020 3:32:53 PM This report has been signed electronically.

## 2020-05-01 NOTE — Progress Notes (Signed)
To PACU, VSS. Report to [email or web address] 

## 2020-05-01 NOTE — Patient Instructions (Signed)
Continue present medications including Trulance.    YOU HAD AN ENDOSCOPIC PROCEDURE TODAY AT THE [facility]:   Refer to the procedure report that was given to you for any specific questions about what was found during the examination.  If the procedure report does not answer your questions, please call your gastroenterologist to clarify.  If you requested that your care partner not be given the details of your procedure findings, then the procedure report has been included in a sealed envelope for you to review at your convenience later.  YOU SHOULD EXPECT: Some feelings of bloating in the abdomen. Passage of more gas than usual.  Walking can help get rid of the air that was put into your GI tract during the procedure and reduce the bloating. If you had a lower endoscopy (such as a colonoscopy or flexible sigmoidoscopy) you may notice spotting of blood in your stool or on the toilet paper. If you underwent a bowel prep for your procedure, you may not have a normal bowel movement for a few days.  Please Note:  You might notice some irritation and congestion in your nose or some drainage.  This is from the oxygen used during your procedure.  There is no need for concern and it should clear up in a day or so.  SYMPTOMS TO REPORT IMMEDIATELY:   Following lower endoscopy (colonoscopy or flexible sigmoidoscopy):  Excessive amounts of blood in the stool  Significant tenderness or worsening of abdominal pains  Swelling of the abdomen that is new, acute  Fever of 100F or higher   Following upper endoscopy (EGD)  Vomiting of blood or coffee ground material  New chest pain or pain under the shoulder blades  Painful or persistently difficult swallowing  New shortness of breath  Fever of 100F or higher  Black, tarry-looking stools  For urgent or emergent issues, a gastroenterologist can be reached at any hour by calling [phone]. Do not use MyChart messaging for urgent concerns.     DIET:  We do recommend a small meal at first, but then you may proceed to your regular diet.  Drink plenty of fluids but you should avoid alcoholic beverages for 24 hours.  ACTIVITY:  You should plan to take it easy for the rest of today and you should NOT DRIVE or use heavy machinery until tomorrow (because of the sedation medicines used during the test).    FOLLOW UP: Our staff will call the number listed on your records 48-72 hours following your procedure to check on you and address any questions or concerns that you may have regarding the information given to you following your procedure. If we do not reach you, we will leave a message.  We will attempt to reach you two times.  During this call, we will ask if you have developed any symptoms of COVID 19. If you develop any symptoms (ie: fever, flu-like symptoms, shortness of breath, cough etc.) before then, please call [phone].  If you test positive for Covid 19 in the 2 weeks post procedure, please call and report this information to [place].    If any biopsies were taken you will be contacted by phone or by letter within the next 1-3 weeks.  Please call us at [phone] if you have not heard about the biopsies in 3 weeks.    SIGNATURES/CONFIDENTIALITY: You and/or your care partner have signed paperwork which will be entered into your electronic medical record.  These signatures attest to  the fact that that the information above on your After Visit Summary has been reviewed and is understood.  Full responsibility of the confidentiality of this discharge information lies with you and/or your care-partner.

## 2020-05-01 NOTE — Op Note (Signed)
[facility] Patient Name: Bethany Navarro Procedure Date: 05/01/2020 2:47 PM MRN: [id number] Endoscopist: Sherilyn Cooter L. Myrtie Neither , MD Age: 24 Referring MD:  Date of Birth: 01-20-1996 Gender: Female Account #: [id number] Procedure:                Colonoscopy Indications:              Abdominal pain in the left upper quadrant,                            Constipation (> decade constipation, recently                            worsened after acute GI illness. LUQ and LLQ crampy                            pain and visible distention) - currently on Trulance Medicines:                Monitored Anesthesia Care Procedure:                Pre-Anesthesia Assessment:                           - Prior to the procedure, a History and Physical                            was performed, and patient medications and                            allergies were reviewed. The patient's tolerance of                            previous anesthesia was also reviewed. The risks                            and benefits of the procedure and the sedation                            options and risks were discussed with the patient.                            All questions were answered, and informed consent                            was obtained. Prior Anticoagulants: The patient has                            taken no previous anticoagulant or antiplatelet                            agents. ASA Grade Assessment: II - A patient with                            mild systemic disease. After reviewing the risks  and benefits, the patient was deemed in                            satisfactory condition to undergo the procedure.                           After obtaining informed consent, the colonoscope                            was passed under direct vision. Throughout the                            procedure, the patient's blood pressure, pulse, and                            oxygen  saturations were monitored continuously. The                            Colonoscope was introduced through the anus and                            advanced to the the terminal ileum, with                            identification of the appendiceal orifice and IC                            valve. The colonoscopy was performed without                            difficulty. The patient tolerated the procedure                            well. The quality of the bowel preparation was                            good. The terminal ileum, ileocecal valve,                            appendiceal orifice, and rectum were photographed. Scope In: 3:07:29 PM Scope Out: 3:16:40 PM Scope Withdrawal Time: 0 hours 6 minutes 21 seconds  Total Procedure Duration: 0 hours 9 minutes 11 seconds  Findings:                 The perianal and digital rectal examinations were                            normal.                           The terminal ileum appeared normal.                           The entire examined colon appeared normal on direct  and retroflexion views. Complications:            No immediate complications. Estimated Blood Loss:     Estimated blood loss: none. Impression:               - The examined portion of the ileum was normal.                           - The entire examined colon is normal on direct and                            retroflexion views.                           - No specimens collected.                           Chronic constipation suspected to be IBS-C or                            dyssynergic defecation with motility affected by                            recent acute GI illness. Recommendation:           - Patient has a contact number available for                            emergencies. The signs and symptoms of potential                            delayed complications were discussed with the                            patient. Return to normal  activities tomorrow.                            Written discharge instructions were provided to the                            patient.                           - Resume previous diet.                           - Continue present medications, including Trulance.                           - No recommendation at this time regarding repeat                            colonoscopy due to young age.                           - Perform anal manometry at appointment to be  scheduled. Dejanira Pamintuan L. Myrtie Neitheranis, MD 05/01/2020 3:30:20 PM This report has been signed electronically.

## 2020-05-02 ENCOUNTER — Other Ambulatory Visit: Payer: Self-pay

## 2020-05-02 DIAGNOSIS — K5909 Other constipation: Secondary | ICD-10-CM

## 2020-05-02 NOTE — Progress Notes (Signed)
Patient is scheduled for Ano rectal manometry on 05/19/20 at 10:30 am at [facility]. Screening COVID test is scheduled for 05/15/20 at 2 pm. Spoke with patient and she is aware that I will send a My Chart message with her appointment information as she was not able to write it down at this time. Advised her to give me a call back if she had any issues with her appointments.

## 2020-05-05 ENCOUNTER — Telehealth: Payer: Self-pay

## 2020-05-05 NOTE — Telephone Encounter (Signed)
  Follow up Call-  Call back number 05/01/2020  Post procedure Call Back phone  # [phone]  Permission to leave phone message Yes  Some recent data might be hidden     Patient questions:  Do you have a fever, pain , or abdominal swelling? No. Pain Score  0 *  Have you tolerated food without any problems? Yes.    Have you been able to return to your normal activities? Yes.    Do you have any questions about your discharge instructions: Diet   No. Medications  No. Follow up visit  No.  Do you have questions or concerns about your Care? No.  Actions: * If pain score is 4 or above: No action needed, pain <4.   1. Have you developed a fever since your procedure? No   2.   Have you had an respiratory symptoms (SOB or cough) since your procedure? No   3.   Have you tested positive for COVID 19 since your procedure? No   4.   Have you had any family members/close contacts diagnosed with the COVID 19 since your procedure?  No    If yes to any of these questions please route to Laverna Peace, RN and Karlton Lemon, RN

## 2020-05-15 ENCOUNTER — Other Ambulatory Visit (HOSPITAL_COMMUNITY)
Admission: RE | Admit: 2020-05-15 | Discharge: 2020-05-15 | Disposition: A | Discharge status: Home / Self Care | Payer: 59 | Source: Ambulatory Visit | Attending: Gastroenterology | Admitting: Gastroenterology

## 2020-05-15 DIAGNOSIS — Z01812 Encounter for preprocedural laboratory examination: Secondary | ICD-10-CM | POA: Diagnosis present

## 2020-05-15 DIAGNOSIS — Z20822 Contact with and (suspected) exposure to covid-19: Secondary | ICD-10-CM | POA: Insufficient documentation

## 2020-05-15 LAB — SARS CORONAVIRUS 2 (TAT 6-24 HRS): SARS Coronavirus 2: NEGATIVE

## 2020-05-19 ENCOUNTER — Ambulatory Visit (HOSPITAL_COMMUNITY)
Admission: RE | Admit: 2020-05-19 | Discharge: 2020-05-19 | Disposition: A | Discharge status: Home / Self Care | Payer: 59 | Attending: Gastroenterology | Admitting: Gastroenterology

## 2020-05-19 ENCOUNTER — Encounter (HOSPITAL_COMMUNITY)
Admission: RE | Disposition: A | Discharge status: Home / Self Care | Payer: Self-pay | Source: Home / Self Care | Attending: Gastroenterology

## 2020-05-19 DIAGNOSIS — K5909 Other constipation: Secondary | ICD-10-CM | POA: Diagnosis not present

## 2020-05-19 DIAGNOSIS — K5902 Outlet dysfunction constipation: Secondary | ICD-10-CM | POA: Diagnosis not present

## 2020-05-19 HISTORY — PX: ANAL RECTAL MANOMETRY: SHX6358

## 2020-05-19 SURGERY — MANOMETRY, ANORECTAL
Anesthesia: LOCAL

## 2020-05-19 NOTE — Progress Notes (Signed)
Anal manometry done per protocol. atient unable to expel balloon within 3 min. Balloon deflated and catheter removed.    Pt tolerated study well without distress or complication.

## 2020-05-21 ENCOUNTER — Encounter (HOSPITAL_COMMUNITY): Payer: Self-pay | Admitting: Gastroenterology

## 2020-06-03 ENCOUNTER — Emergency Department: Admission: EM | Admit: 2020-06-03 | Discharge: 2020-06-03 | Discharge status: Absent without leave | Payer: Self-pay

## 2020-06-05 DIAGNOSIS — K5902 Outlet dysfunction constipation: Secondary | ICD-10-CM

## 2020-06-12 ENCOUNTER — Other Ambulatory Visit: Payer: Self-pay

## 2020-06-12 DIAGNOSIS — K5902 Outlet dysfunction constipation: Secondary | ICD-10-CM

## 2020-10-21 ENCOUNTER — Telehealth: Payer: Self-pay | Admitting: Student

## 2020-10-21 NOTE — Telephone Encounter (Signed)
Called patient. She had questions about being taken off of her Florinef. Patient was informed that Florinef was unsafe during pregnancy and she verbalized understanding. She is to call our office with any questions or concerns that she has in the future.

## 2020-10-21 NOTE — Telephone Encounter (Signed)
Patient called said she is currently pregnant and her OBGYN has taken her off all her medications. She has not had any problems but wants to know what to do if she does since she is expecting. She would like [place] to  Call her. I told her I would pass message to nurse.

## 2020-10-24 ENCOUNTER — Telehealth: Payer: 59 | Admitting: Cardiology

## 2020-11-20 ENCOUNTER — Telehealth: Payer: Self-pay | Admitting: Cardiology

## 2020-11-20 NOTE — Telephone Encounter (Signed)
New message    Pt c/o BP issue: STAT if pt c/o blurred vision, one-sided weakness or slurred speech  1. What are your last 5 BP readings?  120/72 sitting 150/992 standing  2. Are you having any other symptoms (ex. Dizziness, headache, blurred vision, passed out)? Dizziness, headache, blurred vision  3. What is your BP issue?  Yesterday she was sent home from work because she got dizzy felt like she was [place] pass out and was seeing black  Dots , and she had the same feeling all day today

## 2020-11-20 NOTE — Telephone Encounter (Signed)
Pt states that she was work yesterday and started to feel dizzy and like she is going to pass out and her heart started racing. State that she felt like this all day yesterday into today. Reports blurred vision , dizziness, and seeing black dots. States that today she feels somewhat better. After getting home from work yesterday she drank 4 bottles of water. She has stopped taking Florinef 4 months ago d/t being pregnant. Current BP 154/96 Hr 91.

## 2020-11-21 NOTE — Telephone Encounter (Signed)
POTs can cause dizziness but typically not headaches. Are the symptoms only with standing or do any symptoms persist with laying down or sitting. Any reason she would be dehydrated (vomiting, poor oral intake, in hot temperatures). Have her describe the headaches further. Any hsitory of migraines? Marland Kitchen Unfortunately most of the standard POTs medications are not proven to be safe in pregnancy and pregnancy can sometimes make further hemodynamic changes that can increase POTs symptoms. Please update me on the above   Dominga Ferry MD

## 2020-11-21 NOTE — Telephone Encounter (Signed)
Returned call to patient. No answer. Unable to leave msg d/t mailbox being full.

## 2020-11-21 NOTE — Telephone Encounter (Signed)
Pt states that her symptoms are better with sitting and laying. She denies any reason for dehydration, but does report frequent urination. Headaches are dull pain that starts in the back of the head and moves to the front of her eyes. She rates 4/10 for pain. Pt does report light sensitivity with headaches. Pt does have a history of migraines and states last migraine with last year. Please advise.

## 2020-11-21 NOTE — Telephone Encounter (Signed)
Patient called back waiting on a response from Doctor, she would like to know what she should be doing in the mean time until she hears back ?

## 2020-11-21 NOTE — Telephone Encounter (Signed)
14 Weeks on tomorrow.

## 2020-11-21 NOTE — Telephone Encounter (Signed)
Pt notified and voiced understanding 

## 2020-11-21 NOTE — Telephone Encounter (Signed)
How far along is she in her pregnancy?  Would discuss headaches with pcp, could be some component of migraine which can also worsen in pregnancy. For the POTs symptoms limited on medications. WOuld be very aggressive with hydration, 4-6 bottels of gatorade 0 or powerade 0 (or any other low sugar/low calorie sports drink), should also be wearing prescription strength compression stockings. No easy solution for POTs and pregnancy but hopefully with very aggressive hydration can try to limit symptoms.    Dina Rich MD

## 2020-11-22 ENCOUNTER — Emergency Department (HOSPITAL_COMMUNITY)
Admission: EM | Admit: 2020-11-22 | Discharge: 2020-11-22 | Disposition: A | Discharge status: Home / Self Care | Payer: 59 | Attending: Emergency Medicine | Admitting: Emergency Medicine

## 2020-11-22 ENCOUNTER — Encounter (HOSPITAL_COMMUNITY): Payer: Self-pay | Admitting: Emergency Medicine

## 2020-11-22 ENCOUNTER — Other Ambulatory Visit: Payer: Self-pay

## 2020-11-22 DIAGNOSIS — Z3A00 Weeks of gestation of pregnancy not specified: Secondary | ICD-10-CM | POA: Insufficient documentation

## 2020-11-22 DIAGNOSIS — R030 Elevated blood-pressure reading, without diagnosis of hypertension: Secondary | ICD-10-CM | POA: Insufficient documentation

## 2020-11-22 DIAGNOSIS — R42 Dizziness and giddiness: Secondary | ICD-10-CM | POA: Diagnosis not present

## 2020-11-22 DIAGNOSIS — F17210 Nicotine dependence, cigarettes, uncomplicated: Secondary | ICD-10-CM | POA: Diagnosis not present

## 2020-11-22 DIAGNOSIS — O26812 Pregnancy related exhaustion and fatigue, second trimester: Secondary | ICD-10-CM | POA: Insufficient documentation

## 2020-11-22 DIAGNOSIS — J45909 Unspecified asthma, uncomplicated: Secondary | ICD-10-CM | POA: Insufficient documentation

## 2020-11-22 DIAGNOSIS — O99332 Smoking (tobacco) complicating pregnancy, second trimester: Secondary | ICD-10-CM | POA: Diagnosis not present

## 2020-11-22 LAB — COMPREHENSIVE METABOLIC PANEL
ALT: 24 U/L (ref 0–44)
AST: 19 U/L (ref 15–41)
Albumin: 3.8 g/dL (ref 3.5–5.0)
Alkaline Phosphatase: 59 U/L (ref 38–126)
Anion gap: 8 (ref 5–15)
BUN: 5 mg/dL — ABNORMAL LOW (ref 6–20)
CO2: 24 mmol/L (ref 22–32)
Calcium: 8.9 mg/dL (ref 8.9–10.3)
Chloride: 104 mmol/L (ref 98–111)
Creatinine, Ser: 0.55 mg/dL (ref 0.44–1.00)
GFR, Estimated: >60 mL/min (ref >60)
Glucose, Bld: 82 mg/dL (ref 70–99)
Potassium: 3.7 mmol/L (ref 3.5–5.1)
Sodium: 136 mmol/L (ref 135–145)
Total Bilirubin: 0.4 mg/dL (ref 0.3–1.2)
Total Protein: 6.7 g/dL (ref 6.5–8.1)

## 2020-11-22 LAB — CBC WITH DIFFERENTIAL/PLATELET
Abs Immature Granulocytes: 0.06 10*3/uL (ref 0.00–0.07)
Basophils Absolute: 0 10*3/uL (ref 0.0–0.1)
Basophils Relative: 0 %
Eosinophils Absolute: 0.1 10*3/uL (ref 0.0–0.5)
Eosinophils Relative: 1 %
HCT: 40.5 % (ref 36.0–46.0)
Hemoglobin: 13.1 g/dL (ref 12.0–15.0)
Immature Granulocytes: 1 %
Lymphocytes Relative: 27 %
Lymphs Abs: 2.7 10*3/uL (ref 0.7–4.0)
MCH: 26.8 pg (ref 26.0–34.0)
MCHC: 32.3 g/dL (ref 30.0–36.0)
MCV: 83 fL (ref 80.0–100.0)
Monocytes Absolute: 0.6 10*3/uL (ref 0.1–1.0)
Monocytes Relative: 6 %
Neutro Abs: 6.6 10*3/uL (ref 1.7–7.7)
Neutrophils Relative %: 65 %
Platelets: 232 10*3/uL (ref 150–400)
RBC: 4.88 MIL/uL (ref 3.87–5.11)
RDW: 14.4 % (ref 11.5–15.5)
WBC: 10.2 10*3/uL (ref 4.0–10.5)
nRBC: 0 % (ref 0.0–0.2)

## 2020-11-22 LAB — URINALYSIS, ROUTINE W REFLEX MICROSCOPIC
Bilirubin Urine: NEGATIVE
Glucose, UA: NEGATIVE mg/dL
Hgb urine dipstick: NEGATIVE
Ketones, ur: NEGATIVE mg/dL
Leukocytes,Ua: NEGATIVE
Nitrite: NEGATIVE
Protein, ur: NEGATIVE mg/dL
Specific Gravity, Urine: 1.004 — ABNORMAL LOW (ref 1.005–1.030)
pH: 6 (ref 5.0–8.0)

## 2020-11-22 NOTE — ED Notes (Signed)
EKG done and given to Dr Clayborne Dana

## 2020-11-22 NOTE — ED Triage Notes (Signed)
Pt states her BP has been elevated when she "gets up and moves around , but is fine when she is still". Pt reports BP's of 160/90 and 145/100.

## 2020-11-22 NOTE — ED Provider Notes (Signed)
[facility] EMERGENCY DEPARTMENT Provider Note   CSN: [id number] Arrival date & time: 11/22/20  0116     History Chief Complaint  Patient presents with  . Hypertension    Bethany Navarro is a 25 y.o. female.  Approximately 4 months pregnant.  Also has a history of pots.  Patient presents the emergency department today secondary to lightheadedness when she stands up.  She states her blood pressure also goes up when she stands up.  She states that she has been eating drinking urinating normally.  She states that she feels that she is in a pass out when she stands up.  She does know she sees black spots.  She was sent home from work for the same.  She presents the emerge department today this continues.  No other associated symptoms.  No recent illnesses otherwise.  Normal pregnancy.  History of 3 miscarriages.   Hypertension       Past Medical History:  Diagnosis Date  . Allergy   . Anemia   . Anxiety   . Asthma   . Back pain   . Bipolar 1 disorder (HCC)   . Chlamydia infection 01/03/2017  . Depression   . GERD (gastroesophageal reflux disease)   . IBS (irritable bowel syndrome)   . Neck pain   . Palpitations   . POTS (postural orthostatic tachycardia syndrome)   . Shoulder pain, right    frozen shoulder  . SVT (supraventricular tachycardia) (HCC)   . Thoracic outlet syndrome     Patient Active Problem List   Diagnosis Date Noted  . Dyssynergic defecation   . Constipation, outlet dysfunction   . Chlamydia infection 01/03/2017  . Abnormal uterine bleeding (AUB) 12/31/2016  . IUD (intrauterine device) in place 12/31/2016  . LLQ pain 12/31/2016  . Encounter for IUD insertion 08/31/2016    Past Surgical History:  Procedure Laterality Date  . ANAL RECTAL MANOMETRY N/A 05/19/2020   Procedure: ANO RECTAL MANOMETRY;  Surgeon: Sherrilyn Rist, MD;  Location: [facility];  Service: Gastroenterology;  Laterality: N/A;  . 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 COLONOSCOPY    . SHOULDER SURGERY Right    Thoracic outlet  . UPPER GASTROINTESTINAL ENDOSCOPY       OB History   No obstetric history on file.     Family History  Problem Relation Age of Onset  . Hypertension Paternal Grandfather   . Prostate cancer Paternal Grandfather        mets to bone  . Hypertension Paternal Grandmother   . Liver cancer Paternal Grandmother   . Atrial fibrillation Paternal Grandmother   . Irritable bowel syndrome Maternal Grandfather   . Diverticulitis Maternal Grandfather   . Hypertension Father   . Hyperlipidemia Father   . Ovarian cysts Mother   . Ovarian cancer Mother   . Skin cancer Mother   . Irritable bowel syndrome Mother   . Diverticulitis Mother   . Ovarian cancer Other   . Throat cancer Maternal Uncle   . Colon cancer Neg Hx   . Esophageal cancer Neg Hx   . Rectal cancer Neg Hx   . Stomach cancer Neg Hx     Social History   Tobacco Use  . Smoking status: Current Every Day Smoker    Packs/day: 0.25    Years: 5.00    Pack years: 1.25    Types: Cigarettes  . Smokeless tobacco: Never Used  Vaping  Use  . Vaping Use: Never used  Substance Use Topics  . Alcohol use: Yes    Comment: Occ   . Drug use: Never    Home Medications Prior to Admission medications   Medication Sig Start Date End Date Taking? Authorizing Provider  fludrocortisone (FLORINEF) 0.1 MG tablet Take 1 tablet (100 mcg total) by mouth daily. 04/16/20   Strader, Lennart Pall, PA-C  hydrOXYzine (ATARAX/VISTARIL) 25 MG tablet Take 2 tablets by mouth 3 (three) times daily as needed. 02/24/19   [provider]  Levonorgestrel (KYLEENA) 19.5 MG IUD 1 each by Intrauterine route once.     [provider]  metoCLOPramide (REGLAN) 5 MG tablet Take 1 tablet (5 mg total) by mouth every 12 (twelve) hours as needed for up to 2 doses for nausea. Take 30-45 minutes before evening and AM doses of bowel preparation solution. 04/22/20   Sherrilyn Rist,  MD  omeprazole (PRILOSEC) 20 MG capsule Take 20 mg by mouth every morning. 03/16/20   [provider]  Plecanatide 3 MG TABS Take by mouth.    [provider]  VRAYLAR capsule Take 3 mg by mouth daily. 02/15/20   [provider]    Allergies    Bee venom and Codeine  Review of Systems   Review of Systems  All other systems reviewed and are negative.   Physical Exam Updated Vital Signs BP 114/82   Pulse 64   Temp 98.4 F (36.9 C) (Oral)   Resp 14   Ht 5\' 3"  (1.6 m)   Wt 82 kg   SpO2 100%   BMI 32.02 kg/m   Physical Exam Vitals and nursing note reviewed.  Constitutional:      Appearance: She is well-developed.  HENT:     Head: Normocephalic and atraumatic.     Mouth/Throat:     Mouth: Mucous membranes are moist.     Pharynx: Oropharynx is clear.  Eyes:     Pupils: Pupils are equal, round, and reactive to light.  Cardiovascular:     Rate and Rhythm: Normal rate and regular rhythm.  Pulmonary:     Effort: No respiratory distress.     Breath sounds: No stridor.  Abdominal:     General: Abdomen is flat. There is no distension.  Musculoskeletal:        General: No swelling or tenderness. Normal range of motion.     Cervical back: Normal range of motion.  Skin:    General: Skin is warm and dry.  Neurological:     General: No focal deficit present.     Mental Status: She is alert.     ED Results / Procedures / Treatments   Labs (all labs ordered are listed, but only abnormal results are displayed) Labs Reviewed  COMPREHENSIVE METABOLIC PANEL - Abnormal; Notable for the following components:      Result Value   BUN 5 (*)    All other components within normal limits  URINALYSIS, ROUTINE W REFLEX MICROSCOPIC - Abnormal; Notable for the following components:   Color, Urine STRAW (*)    Specific Gravity, Urine 1.004 (*)    All other components within normal limits  CBC WITH DIFFERENTIAL/PLATELET    EKG EKG  Interpretation  Date/Time:  Saturday November 22 2020 03:15:41 EDT Ventricular Rate:  62 PR Interval:  158 QRS Duration: 93 QT Interval:  387 QTC Calculation: 393 R Axis:   49 Text Interpretation: Sinus rhythm No significant change since last tracing  Confirmed by Marily Memos [phone]) on 11/22/2020 3:40:37 AM   Radiology No results found.  Procedures Procedures   Medications Ordered in ED Medications - No data to display  ED Course  I have reviewed the triage vital signs and the nursing notes.  Pertinent labs & imaging results that were available during my care of the patient were reviewed by me and considered in my medical decision making (see chart for details).    MDM Rules/Calculators/A&P                          Her work-up here is reassuring.  Of low suspicion for any significant dehydration or arrhythmia causing her lightheadedness.  Prior related to her pots.  Her vital signs are within normal limits.  Orthostatic vital signs within normal [place] she denies any significant hypertension, hypotension or tachycardia.  Patient stable for discharge at this time. PCP/OB follow up recommended.   Final Clinical Impression(s) / ED Diagnoses Final diagnoses:  Light headedness    Rx / DC Orders ED Discharge Orders    None       Wilhelmine Krogstad, Barbara Cower, MD 11/22/20 [phone]

## 2021-10-03 IMAGING — DX DG CHEST 1V PORT
1 series · 1 of 1 positions shown · non-contrast
Comparison: Portable exam 1100 hours compared to 07/22/2019

CLINICAL DATA: Tachycardia, heart rate in 160s for 2 days, history
of smoking, SVT

EXAM:
PORTABLE CHEST 1 VIEW

[chest ap grid]
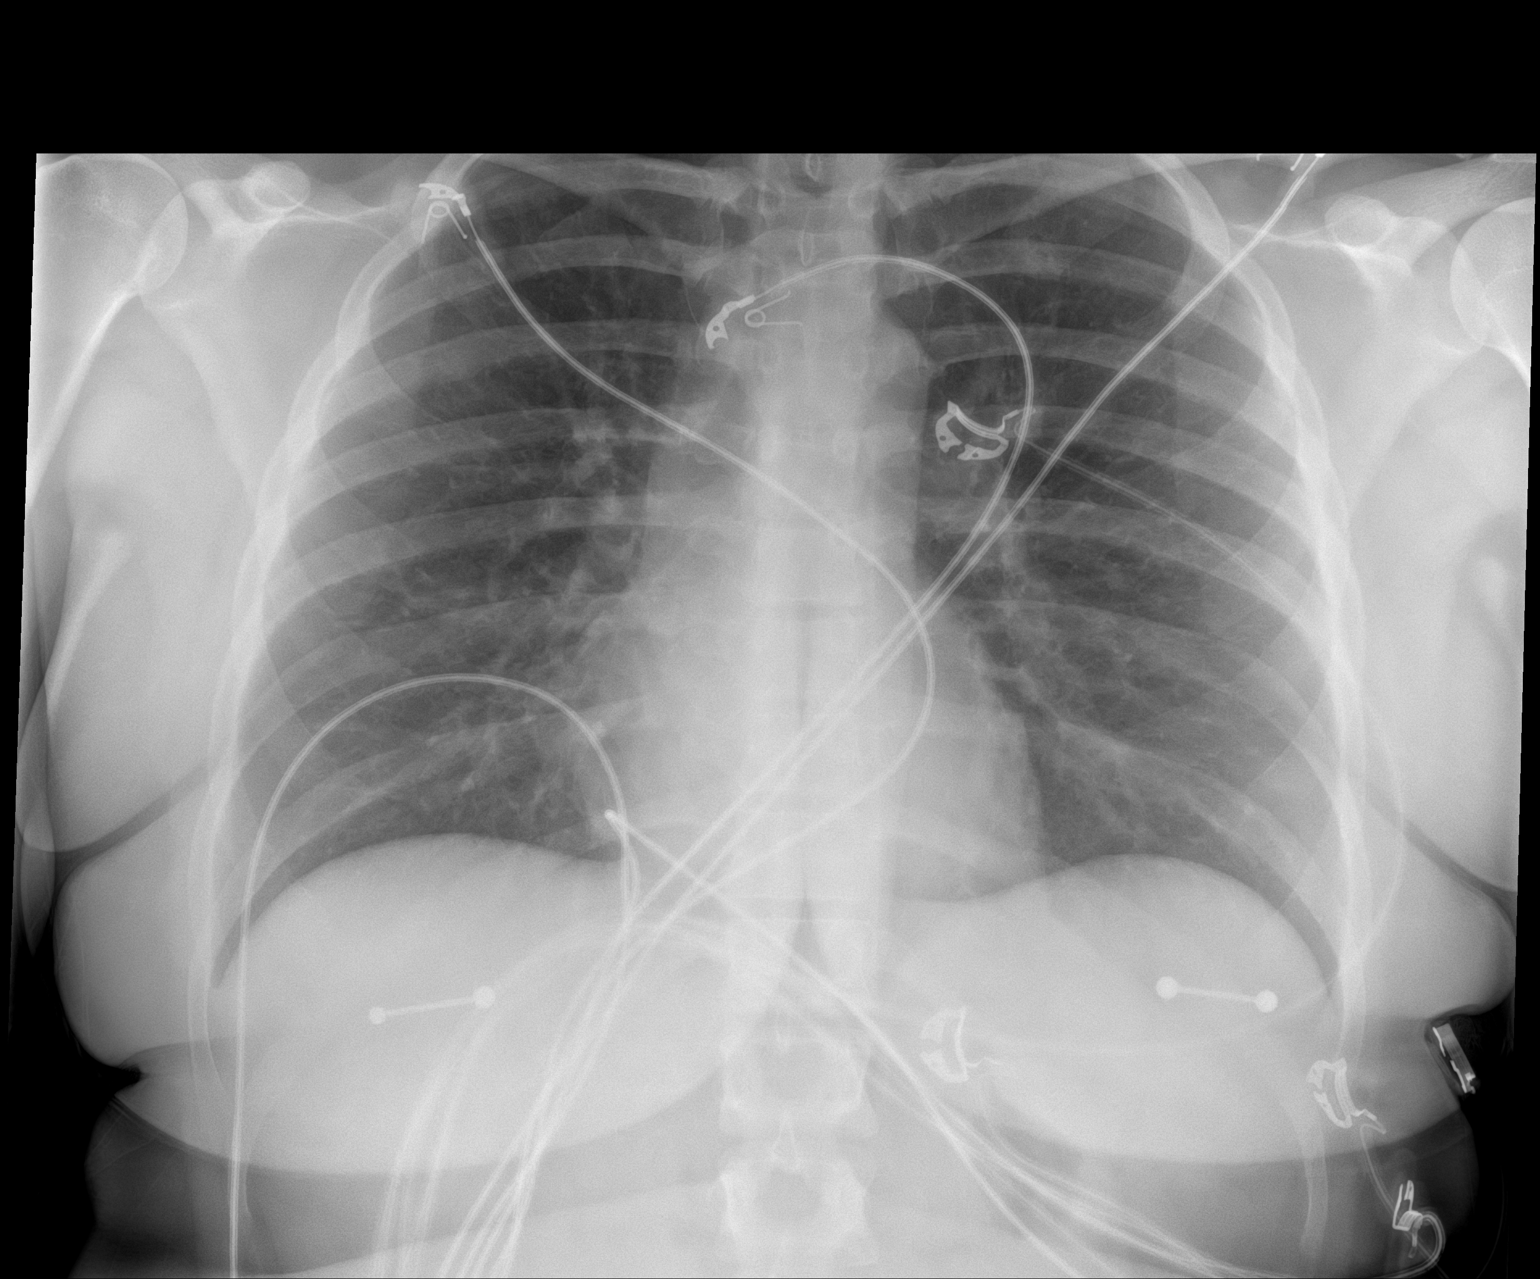

[1 of 1 positions shown; findings below may reference images not displayed]

FINDINGS: Normal heart size, mediastinal contours, and pulmonary vascularity.

Lungs clear.

No pulmonary infiltrate, pleural effusion or pneumothorax.

Osseous structures unremarkable.
IMPRESSION: No acute abnormalities.

## 2021-12-11 ENCOUNTER — Encounter: Payer: Self-pay | Admitting: Emergency Medicine

## 2021-12-11 ENCOUNTER — Ambulatory Visit
Admission: EM | Admit: 2021-12-11 | Discharge: 2021-12-11 | Disposition: A | Discharge status: Home / Self Care | Payer: 59 | Attending: Family Medicine | Admitting: Family Medicine

## 2021-12-11 DIAGNOSIS — J039 Acute tonsillitis, unspecified: Secondary | ICD-10-CM | POA: Diagnosis not present

## 2021-12-11 LAB — POCT RAPID STREP A (OFFICE): Rapid Strep A Screen: NEGATIVE

## 2021-12-11 MED ORDER — LIDOCAINE VISCOUS HCL 2 % MT SOLN: 15.0000 mL | OROMUCOSAL | 0 refills | Status: AC | PRN

## 2021-12-11 MED ORDER — AMOXICILLIN 875 MG PO TABS: 875.0000 mg | ORAL_TABLET | Freq: Two times a day (BID) | ORAL | 0 refills | Status: AC

## 2021-12-11 NOTE — ED Provider Notes (Signed)
?[facility] ? ? ? ?CSN: [id number] ?Arrival date & time: 12/11/21  1716 ? ? ?  ? ?History   ?Chief Complaint ?Chief Complaint  ?Patient presents with  ? Fever  ? Sore Throat  ? ? ?HPI ?Bethany Navarro is a 26 y.o. female.  ? ?HPI ?Patient presents today with 24 hours of worsening sore throat, fever, and generally feeling unwell.  She reports his painful with swallowing.  She has no history of recurrent strep as an adult although has had strep as a child. ?She is unaware of any known contact with anyone who is recently been sick.  Majority of her throat pain is localized to the left side including her left ear. Denies any other symptoms. ?Past Medical History:  ?Diagnosis Date  ? Allergy   ? Anemia   ? Anxiety   ? Asthma   ? Back pain   ? Bipolar 1 disorder ([place])   ? Chlamydia infection 01/03/2017  ? Depression   ? GERD (gastroesophageal reflux disease)   ? IBS (irritable bowel syndrome)   ? Neck pain   ? Palpitations   ? POTS (postural orthostatic tachycardia syndrome)   ? Shoulder pain, right   ? frozen shoulder  ? SVT (supraventricular tachycardia) ([place])   ? Thoracic outlet syndrome   ? ? ?Patient Active Problem List  ? Diagnosis Date Noted  ? Dyssynergic defecation   ? Constipation, outlet dysfunction   ? Chlamydia infection 01/03/2017  ? Abnormal uterine bleeding (AUB) 12/31/2016  ? IUD (intrauterine device) in place 12/31/2016  ? LLQ pain 12/31/2016  ? Encounter for IUD insertion 08/31/2016  ? ? ?Past Surgical History:  ?Procedure Laterality Date  ? ANAL RECTAL MANOMETRY N/A 05/19/2020  ? Procedure: ANO RECTAL MANOMETRY;  Surgeon: Doran Stabler, MD;  Location: [facility];  Service: Gastroenterology;  Laterality: N/A;  ? BONE RESECTION, RIB    ? 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 COLONOSCOPY    ? SHOULDER SURGERY Right   ? Thoracic outlet  ? UPPER GASTROINTESTINAL ENDOSCOPY    ? ? ?OB History   ?No obstetric history on file. ?  ? ? ? ?Home Medications   ? ?Prior to Admission medications    ?Medication Sig Start Date End Date Taking? Authorizing Provider  ?amoxicillin (AMOXIL) 875 MG tablet Take 1 tablet (875 mg total) by mouth 2 (two) times daily. 12/11/21  Yes Scot Jun, FNP  ?lidocaine (XYLOCAINE) 2 % solution Use as directed 15 mLs in the mouth or throat every 3 (three) hours as needed for mouth pain (mix with warm water gargle and spit). 12/11/21  Yes Scot Jun, FNP  ?fludrocortisone (FLORINEF) 0.1 MG tablet Take 1 tablet (100 mcg total) by mouth daily. 04/16/20   Strader, Fransisco Hertz, PA-C  ?hydrOXYzine (ATARAX/VISTARIL) 25 MG tablet Take 2 tablets by mouth 3 (three) times daily as needed. 02/24/19   [provider]  ?Levonorgestrel (KYLEENA) 19.5 MG IUD 1 each by Intrauterine route once.     [provider]  ?metoCLOPramide (REGLAN) 5 MG tablet Take 1 tablet (5 mg total) by mouth every 12 (twelve) hours as needed for up to 2 doses for nausea. Take 30-45 minutes before evening and AM doses of bowel preparation solution. 04/22/20   Doran Stabler, MD  ?omeprazole (PRILOSEC) 20 MG capsule Take 20 mg by mouth every morning. 03/16/20   [provider]  ?Plecanatide 3 MG TABS Take by mouth.    [provider]  ?VRAYLAR capsule Take 3 mg by mouth daily. 02/15/20   [provider]  ? ? ?Family History ?Family History  ?Problem Relation Age of Onset  ? Hypertension Paternal Grandfather   ? Prostate cancer Paternal Grandfather   ?     mets to bone  ? Hypertension Paternal Grandmother   ? Liver cancer Paternal Grandmother   ? Atrial fibrillation Paternal Grandmother   ? Irritable bowel syndrome Maternal Grandfather   ? Diverticulitis Maternal Grandfather   ? Hypertension Father   ? Hyperlipidemia Father   ? Ovarian cysts Mother   ? Ovarian cancer Mother   ? Skin cancer Mother   ? Irritable bowel syndrome Mother   ? Diverticulitis Mother   ? Ovarian cancer Other   ? Throat cancer Maternal Uncle   ? Colon cancer Neg Hx   ? Esophageal cancer Neg Hx   ?  Rectal cancer Neg Hx   ? Stomach cancer Neg Hx   ? ? ?Social History ?Social History  ? ?Tobacco Use  ? Smoking status: Every Day  ?  Packs/day: 0.25  ?  Years: 5.00  ?  Pack years: 1.25  ?  Types: Cigarettes  ? Smokeless tobacco: Never  ?Vaping Use  ? Vaping Use: Never used  ?Substance Use Topics  ? Alcohol use: Yes  ?  Comment: Occ   ? Drug use: Never  ? ? ? ?Allergies   ?Bee venom and Codeine ? ? ?Review of Systems ?Review of Systems ?Pertinent negatives listed in HPI  ? ?Physical Exam ?Triage Vital Signs ?ED Triage Vitals  ?Enc Vitals Group  ?   BP 12/11/21 1752 121/77  ?   Pulse Rate 12/11/21 1752 (!) 105  ?   Resp 12/11/21 1752 18  ?   Temp 12/11/21 1752 (!) 100.5 ?F (38.1 ?C)  ?   Temp Source 12/11/21 1752 Oral  ?   SpO2 12/11/21 1752 98 %  ?   Weight --   ?   Height --   ?   Head Circumference --   ?   Peak Flow --   ?   Pain Score 12/11/21 1737 7  ?   Pain Loc --   ?   Pain Edu? --   ?   Excl. in [place]? --   ? ?No data found. ? ?Updated Vital Signs ?BP 121/77 (BP Location: Left Arm)   Pulse (!) 105   Temp (!) 100.5 ?F (38.1 ?C) (Oral)   Resp 18   SpO2 98%  ? ?Visual Acuity ?Right Eye Distance:   ?Left Eye Distance:   ?Bilateral Distance:   ? ?Right Eye Near:   ?Left Eye Near:    ?Bilateral Near:    ? ?Physical Exam ?Constitutional:   ?   Appearance: She is ill-appearing.  ?HENT:  ?   Head: Normocephalic and atraumatic.  ?   Right Ear: Tympanic membrane and ear canal normal.  ?   Left Ear: Tympanic membrane and ear canal normal.  ?   Mouth/Throat:  ?   Pharynx: Pharyngeal swelling, oropharyngeal exudate, posterior oropharyngeal erythema and uvula swelling present.  ?   Tonsils: Tonsillar exudate present. 2+ on the right. 3+ on the left.  ?Cardiovascular:  ?   Rate and Rhythm: Regular rhythm. Tachycardia present.  ?Pulmonary:  ?   Effort: Pulmonary effort is normal.  ?   Breath sounds: Normal breath sounds.  ?Musculoskeletal:  ?   Cervical back: Normal range of motion.  ?Lymphadenopathy:  ?  Cervical:  Cervical adenopathy present.  ?Skin: ?   General: Skin is warm and dry.  ?   Capillary Refill: Capillary refill takes less than 2 seconds.  ?Neurological:  ?   General: No focal deficit present.  ?   Mental Status: She is alert.  ? ? ? ?UC Treatments / Results  ?Labs ?(all labs ordered are listed, but only abnormal results are displayed) ?Labs Reviewed  ?POCT RAPID STREP A (OFFICE) - Normal  ? ? ?EKG ? ? ?Radiology ?No results found. ? ?Procedures ?Procedures (including critical care time) ? ?Medications Ordered in UC ?Medications - No data to display ? ?Initial Impression / Assessment and Plan / UC Course  ?I have reviewed the triage vital signs and the nursing notes. ? ?Pertinent labs & imaging results that were available during my care of the patient were reviewed by me and considered in my medical decision making (see chart for details). ? ?  ?Acute tonsillitis treatment with amoxicillin twice daily for 10 days.  Lidocaine viscous as needed as needed for throat pain.  Tylenol as needed for fever, body aches.  Force fluids.  Return precautions given if symptoms worsen or do not improve. ?Final Clinical Impressions(s) / UC Diagnoses  ? ?Final diagnoses:  ?Acute tonsillitis, unspecified etiology  ? ?Discharge Instructions   ?None ?  ? ?ED Prescriptions   ? ? Medication Sig Dispense Auth. Provider  ? amoxicillin (AMOXIL) 875 MG tablet Take 1 tablet (875 mg total) by mouth 2 (two) times daily. 20 tablet Scot Jun, FNP  ? lidocaine (XYLOCAINE) 2 % solution Use as directed 15 mLs in the mouth or throat every 3 (three) hours as needed for mouth pain (mix with warm water gargle and spit). 50 mL Scot Jun, FNP  ? ?  ? ?PDMP not reviewed this encounter. ?  ?Scot Jun, FNP ?12/11/21 1926 ? ?

## 2021-12-11 NOTE — ED Triage Notes (Signed)
Pt here with left sided sore throat and fever starting last night.  ?

## 2021-12-15 ENCOUNTER — Emergency Department (INDEPENDENT_AMBULATORY_CARE_PROVIDER_SITE_OTHER)
Admission: EM | Admit: 2021-12-15 | Discharge: 2021-12-15 | Disposition: A | Discharge status: Home / Self Care | Payer: 59 | Source: Home / Self Care

## 2021-12-15 DIAGNOSIS — J039 Acute tonsillitis, unspecified: Secondary | ICD-10-CM

## 2021-12-15 MED ORDER — PREDNISONE 20 MG PO TABS: ORAL_TABLET | ORAL | 0 refills | Status: AC

## 2021-12-15 NOTE — ED Triage Notes (Addendum)
Pt here today for sore throat since Thurs. Was seen at another UC on Fri. Rx'd amoxicillin. Says problem Is worsening, still having intermittent fevers. Also taking tylenol and motrin prn. Pain 7/10 Not breastfeeding. ?

## 2021-12-15 NOTE — ED Provider Notes (Signed)
?[facility] ? ? ? ?CSN: [id number] ?Arrival date & time: 12/15/21  1643 ? ? ?  ? ?History   ?Chief Complaint ?Chief Complaint  ?Patient presents with  ? Sore Throat  ? ? ?HPI ?Bethany Navarro is a 26 y.o. female.  ? ?HPI patient here for sore throat since Thursday.  Patient was evaluated at [facility] on 12/11/2021 evaluated and treated for tonsillitis-Amoxicillin for 10 days.  Patient currently rates ST pain as 7 of 10.  We will be evaluating patient's-month-old daughter today as well.  PMH significant for IBS and Bipolar 1 disorder. ? ?Past Medical History:  ?Diagnosis Date  ? Allergy   ? Anemia   ? Anxiety   ? Asthma   ? Back pain   ? Bipolar 1 disorder (HCC)   ? Chlamydia infection 01/03/2017  ? Depression   ? GERD (gastroesophageal reflux disease)   ? IBS (irritable bowel syndrome)   ? Neck pain   ? Palpitations   ? POTS (postural orthostatic tachycardia syndrome)   ? Shoulder pain, right   ? frozen shoulder  ? SVT (supraventricular tachycardia) (HCC)   ? Thoracic outlet syndrome   ? ? ?Patient Active Problem List  ? Diagnosis Date Noted  ? Dyssynergic defecation   ? Constipation, outlet dysfunction   ? Chlamydia infection 01/03/2017  ? Abnormal uterine bleeding (AUB) 12/31/2016  ? IUD (intrauterine device) in place 12/31/2016  ? LLQ pain 12/31/2016  ? Encounter for IUD insertion 08/31/2016  ? ? ?Past Surgical History:  ?Procedure Laterality Date  ? ANAL RECTAL MANOMETRY N/A 05/19/2020  ? Procedure: ANO RECTAL MANOMETRY;  Surgeon: Sherrilyn Rist, MD;  Location: [facility];  Service: Gastroenterology;  Laterality: N/A;  ? BONE RESECTION, RIB    ? 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 COLONOSCOPY    ? SHOULDER SURGERY Right   ? Thoracic outlet  ? UPPER GASTROINTESTINAL ENDOSCOPY    ? ? ?OB History   ?No obstetric history on file. ?  ? ? ? ?Home Medications   ? ?Prior to Admission medications   ?Medication Sig Start Date End Date Taking? Authorizing Provider  ?predniSONE (DELTASONE) 20 MG tablet Take 3 tabs  PO daily x 5 days. 12/15/21  Yes Trevor Iha, FNP  ?ADDERALL XR 10 MG 24 hr capsule Take 15 mg by mouth daily. 07/16/21   [provider]  ?amoxicillin (AMOXIL) 875 MG tablet Take 1 tablet (875 mg total) by mouth 2 (two) times daily. 12/11/21   Bing Neighbors, FNP  ?fludrocortisone (FLORINEF) 0.1 MG tablet Take 1 tablet (100 mcg total) by mouth daily. 04/16/20   Strader, Lennart Pall, PA-C  ?hydrOXYzine (ATARAX/VISTARIL) 25 MG tablet Take 2 tablets by mouth 3 (three) times daily as needed. 02/24/19   [provider]  ?Levonorgestrel (KYLEENA) 19.5 MG IUD 1 each by Intrauterine route once.     [provider]  ?lidocaine (XYLOCAINE) 2 % solution Use as directed 15 mLs in the mouth or throat every 3 (three) hours as needed for mouth pain (mix with warm water gargle and spit). 12/11/21   Bing Neighbors, FNP  ?metoCLOPramide (REGLAN) 5 MG tablet Take 1 tablet (5 mg total) by mouth every 12 (twelve) hours as needed for up to 2 doses for nausea. Take 30-45 minutes before evening and AM doses of bowel preparation solution. 04/22/20   Sherrilyn Rist, MD  ?omeprazole (PRILOSEC) 20 MG capsule Take 20 mg by mouth every morning. 03/16/20  [provider]  ?Plecanatide 3 MG TABS Take by mouth.    [provider]  ?VRAYLAR capsule Take 3 mg by mouth daily. 02/15/20   [provider]  ? ? ?Family History ?Family History  ?Problem Relation Age of Onset  ? Hypertension Paternal Grandfather   ? Prostate cancer Paternal Grandfather   ?     mets to bone  ? Hypertension Paternal Grandmother   ? Liver cancer Paternal Grandmother   ? Atrial fibrillation Paternal Grandmother   ? Irritable bowel syndrome Maternal Grandfather   ? Diverticulitis Maternal Grandfather   ? Hypertension Father   ? Hyperlipidemia Father   ? Ovarian cysts Mother   ? Ovarian cancer Mother   ? Skin cancer Mother   ? Irritable bowel syndrome Mother   ? Diverticulitis Mother   ? Ovarian cancer Other   ? Throat  cancer Maternal Uncle   ? Colon cancer Neg Hx   ? Esophageal cancer Neg Hx   ? Rectal cancer Neg Hx   ? Stomach cancer Neg Hx   ? ? ?Social History ?Social History  ? ?Tobacco Use  ? Smoking status: Every Day  ?  Packs/day: 0.25  ?  Years: 5.00  ?  Pack years: 1.25  ?  Types: Cigarettes  ? Smokeless tobacco: Never  ?Vaping Use  ? Vaping Use: Never used  ?Substance Use Topics  ? Alcohol use: Yes  ?  Comment: Occ   ? Drug use: Never  ? ? ? ?Allergies   ?Bee venom and Codeine ? ? ?Review of Systems ?Review of Systems  ?HENT:  Positive for sore throat.   ?All other systems reviewed and are negative. ? ? ?Physical Exam ?Triage Vital Signs ?ED Triage Vitals [12/15/21 1708]  ?Enc Vitals Group  ?   BP   ?   Pulse   ?   Resp   ?   Temp   ?   Temp src   ?   SpO2   ?   Weight   ?   Height   ?   Head Circumference   ?   Peak Flow   ?   Pain Score 7  ?   Pain Loc   ?   Pain Edu?   ?   Excl. in GC?   ? ?No data found. ? ?Updated Vital Signs ?BP 129/85 (BP Location: Left Arm)   Pulse 89   Temp 99.3 ?F (37.4 ?C) (Oral)   Resp 18   LMP 12/09/2021   SpO2 99%  ? ? ?Physical Exam ?Vitals and nursing note reviewed.  ?Constitutional:   ?   Appearance: She is well-developed. She is obese. She is not ill-appearing.  ?HENT:  ?   Head: Normocephalic and atraumatic.  ?   Right Ear: Tympanic membrane and ear canal normal.  ?   Left Ear: Tympanic membrane and ear canal normal.  ?   Mouth/Throat:  ?   Mouth: Mucous membranes are moist.  ?   Pharynx: Uvula midline. Pharyngeal swelling, posterior oropharyngeal erythema and uvula swelling present.  ?   Tonsils: 3+ on the right. 2+ on the left.  ?Eyes:  ?   Conjunctiva/sclera: Conjunctivae normal.  ?   Pupils: Pupils are equal, round, and reactive to light.  ?Cardiovascular:  ?   Rate and Rhythm: Normal rate and regular rhythm.  ?   Heart sounds: Normal heart sounds. No murmur heard. ?Pulmonary:  ?   Effort: Pulmonary effort is normal.  ?  Breath sounds: Normal breath sounds. No wheezing,  rhonchi or rales.  ?Musculoskeletal:  ?   Cervical back: Normal range of motion and neck supple.  ?Skin: ?   General: Skin is warm and dry.  ?Neurological:  ?   General: No focal deficit present.  ?   Mental Status: She is alert and oriented to person, place, and time.  ? ? ? ?UC Treatments / Results  ?Labs ?(all labs ordered are listed, but only abnormal results are displayed) ?Labs Reviewed - No data to display ? ?EKG ? ? ?Radiology ?No results found. ? ?Procedures ?Procedures (including critical care time) ? ?Medications Ordered in UC ?Medications - No data to display ? ?Initial Impression / Assessment and Plan / UC Course  ?I have reviewed the triage vital signs and the nursing notes. ? ?Pertinent labs & imaging results that were available during my care of the patient were reviewed by me and considered in my medical decision making (see chart for details). ? ?  ? ?MDM: 1.  Tonsillitis-Rx'd Prednisone. Advised patient to continue previously prescribed Amoxicillin as directed.  Instructed patient to take prednisone with first dose of Amoxicillin for the next 5 days.  Encouraged patient to increase daily water intake while taking these medications.  Advised patient if symptoms worsen and/or unresolved please follow-up with PCP or here for further evaluation. ?Final Clinical Impressions(s) / UC Diagnoses  ? ?Final diagnoses:  ?Tonsillitis  ? ? ? ?Discharge Instructions   ? ?  ?Advised patient to continue previously prescribed Amoxicillin as directed.  Instructed patient to take prednisone with first dose of Amoxicillin for the next 5 days.  Encouraged patient to increase daily water intake while taking these medications.  Advised patient if symptoms worsen and/or unresolved please follow-up with PCP or here for further evaluation. ? ? ? ? ?ED Prescriptions   ? ? Medication Sig Dispense Auth. Provider  ? predniSONE (DELTASONE) 20 MG tablet Take 3 tabs PO daily x 5 days. 15 tablet Trevor Iha, FNP  ? ?  ? ?PDMP  not reviewed this encounter. ?  ?Trevor Iha, FNP ?12/15/21 1744 ? ?

## 2021-12-15 NOTE — Discharge Instructions (Addendum)
Advised patient to continue previously prescribed Amoxicillin as directed.  Instructed patient to take Prednisone with first dose of Amoxicillin for the next 5 days.  Encouraged patient to increase daily water intake while taking these medications.  Advised patient if symptoms worsen and/or unresolved please follow-up with PCP or here for further evaluation. ?

## 2022-06-11 ENCOUNTER — Inpatient Hospital Stay: Payer: 59

## 2022-06-11 ENCOUNTER — Inpatient Hospital Stay: Payer: 59 | Attending: Internal Medicine | Admitting: Internal Medicine

## 2022-06-11 ENCOUNTER — Encounter: Payer: Self-pay | Admitting: Internal Medicine

## 2022-06-11 VITALS — BP 122/82 | HR 88 | Temp 96.6°F | Resp 20 | Wt 178.2 lb

## 2022-06-11 DIAGNOSIS — Z7952 Long term (current) use of systemic steroids: Secondary | ICD-10-CM | POA: Diagnosis not present

## 2022-06-11 DIAGNOSIS — Z8042 Family history of malignant neoplasm of prostate: Secondary | ICD-10-CM | POA: Diagnosis not present

## 2022-06-11 DIAGNOSIS — K589 Irritable bowel syndrome without diarrhea: Secondary | ICD-10-CM | POA: Diagnosis not present

## 2022-06-11 DIAGNOSIS — D649 Anemia, unspecified: Secondary | ICD-10-CM

## 2022-06-11 DIAGNOSIS — Z8041 Family history of malignant neoplasm of ovary: Secondary | ICD-10-CM | POA: Insufficient documentation

## 2022-06-11 DIAGNOSIS — N920 Excessive and frequent menstruation with regular cycle: Secondary | ICD-10-CM | POA: Insufficient documentation

## 2022-06-11 DIAGNOSIS — G540 Brachial plexus disorders: Secondary | ICD-10-CM | POA: Insufficient documentation

## 2022-06-11 DIAGNOSIS — Z800 Family history of malignant neoplasm of digestive organs: Secondary | ICD-10-CM | POA: Insufficient documentation

## 2022-06-11 DIAGNOSIS — Z8379 Family history of other diseases of the digestive system: Secondary | ICD-10-CM | POA: Insufficient documentation

## 2022-06-11 DIAGNOSIS — D500 Iron deficiency anemia secondary to blood loss (chronic): Secondary | ICD-10-CM | POA: Insufficient documentation

## 2022-06-11 DIAGNOSIS — Z8349 Family history of other endocrine, nutritional and metabolic diseases: Secondary | ICD-10-CM | POA: Insufficient documentation

## 2022-06-11 DIAGNOSIS — K219 Gastro-esophageal reflux disease without esophagitis: Secondary | ICD-10-CM | POA: Insufficient documentation

## 2022-06-11 DIAGNOSIS — G90A Postural orthostatic tachycardia syndrome (POTS): Secondary | ICD-10-CM | POA: Diagnosis not present

## 2022-06-11 DIAGNOSIS — Z79899 Other long term (current) drug therapy: Secondary | ICD-10-CM | POA: Insufficient documentation

## 2022-06-11 DIAGNOSIS — F32A Depression, unspecified: Secondary | ICD-10-CM | POA: Diagnosis not present

## 2022-06-11 DIAGNOSIS — Z91030 Bee allergy status: Secondary | ICD-10-CM | POA: Insufficient documentation

## 2022-06-11 DIAGNOSIS — Z8249 Family history of ischemic heart disease and other diseases of the circulatory system: Secondary | ICD-10-CM | POA: Insufficient documentation

## 2022-06-11 DIAGNOSIS — Z808 Family history of malignant neoplasm of other organs or systems: Secondary | ICD-10-CM | POA: Diagnosis not present

## 2022-06-11 DIAGNOSIS — F319 Bipolar disorder, unspecified: Secondary | ICD-10-CM | POA: Insufficient documentation

## 2022-06-11 DIAGNOSIS — Z885 Allergy status to narcotic agent status: Secondary | ICD-10-CM | POA: Diagnosis not present

## 2022-06-11 DIAGNOSIS — F17210 Nicotine dependence, cigarettes, uncomplicated: Secondary | ICD-10-CM | POA: Insufficient documentation

## 2022-06-11 LAB — CBC WITH DIFFERENTIAL/PLATELET
Abs Immature Granulocytes: 0.03 10*3/uL (ref 0.00–0.07)
Basophils Absolute: 0.1 10*3/uL (ref 0.0–0.1)
Basophils Relative: 1 %
Eosinophils Absolute: 0.2 10*3/uL (ref 0.0–0.5)
Eosinophils Relative: 3 %
HCT: 38.7 % (ref 36.0–46.0)
Hemoglobin: 11.9 g/dL — ABNORMAL LOW (ref 12.0–15.0)
Immature Granulocytes: 0 %
Lymphocytes Relative: 26 %
Lymphs Abs: 1.9 10*3/uL (ref 0.7–4.0)
MCH: 23 pg — ABNORMAL LOW (ref 26.0–34.0)
MCHC: 30.7 g/dL (ref 30.0–36.0)
MCV: 74.7 fL — ABNORMAL LOW (ref 80.0–100.0)
Monocytes Absolute: 0.7 10*3/uL (ref 0.1–1.0)
Monocytes Relative: 9 %
Neutro Abs: 4.5 10*3/uL (ref 1.7–7.7)
Neutrophils Relative %: 61 %
Platelets: 341 10*3/uL (ref 150–400)
RBC: 5.18 MIL/uL — ABNORMAL HIGH (ref 3.87–5.11)
RDW: 15.3 % (ref 11.5–15.5)
WBC: 7.4 10*3/uL (ref 4.0–10.5)
nRBC: 0 % (ref 0.0–0.2)

## 2022-06-11 LAB — IRON AND TIBC
Iron: 33 ug/dL (ref 28–170)
Saturation Ratios: 7 % — ABNORMAL LOW (ref 10.4–31.8)
TIBC: 487 ug/dL — ABNORMAL HIGH (ref 250–450)
UIBC: 454 ug/dL

## 2022-06-11 LAB — FERRITIN: Ferritin: 6 ng/mL — ABNORMAL LOW (ref 11–307)

## 2022-06-11 LAB — VITAMIN B12: Vitamin B-12: 304 pg/mL (ref 180–914)

## 2022-06-11 LAB — FOLATE: Folate: 6.6 ng/mL (ref >5.9)

## 2022-06-11 NOTE — Progress Notes (Signed)
Bethany Navarro  Telephone:([phone] Fax:(336) [phone]  ID: Bethany Navarro OB: 04/22/1996  MR#: [id number]  FRT#:[id number]  Patient Care Team: Rebekah Chesterfield, NP as PCP - General (Internal Medicine) Wyline Mood Dorothe Pea, MD as PCP - Cardiology (Cardiology) Patient, No Pcp Per (General Practice)  REFERRING PROVIDER: Gilman Schmidt, NP  REASON FOR REFERRAL: anemia   HPI: Bethany Navarro is a 26 y.o. female with past medical history of anemia, anxiety, depression, bipolar disorder, GERD, IBS-C, POTS, thoracic outlet syndrome was referred to hematology for further management of anemia.  Patient reports history of anemia since 2016.  In 2022, she had a child and had postpartum hemorrhage. After that she received IV iron.  She has been taking iron pills for past 3 years with no improvement in iron levels.  It makes her constipated.  She had Colonoscopy done in September 2021 for left-sided abdominal pain and chronic constipation was normal.  EGD done in September 2021 for reflux symptoms and globus sensation was also normal.  She reports heavy menstrual period's.  Denies bleeding in stool or urine.  Labs reviewed from 01/20/2022.  CBC showed hemoglobin 10.9, WBC 8.3, platelet 403, MCV 73.5.  Vitamin B12 306.  Folate 4.8.  Iron 34, saturation 7%.  Ferritin not available.  TSH normal.       REVIEW OF SYSTEMS:   ROS  As per HPI. Otherwise, a complete review of systems is negative.  PAST MEDICAL HISTORY: Past Medical History:  Diagnosis Date   Allergy    Anemia    Anxiety    Asthma    Back pain    Bipolar 1 disorder (HCC)    Chlamydia infection 01/03/2017   Depression    GERD (gastroesophageal reflux disease)    IBS (irritable bowel syndrome)    Neck pain    Palpitations    POTS (postural orthostatic tachycardia syndrome)    Shoulder pain, right    frozen shoulder   SVT (supraventricular tachycardia) (HCC)    Thoracic outlet syndrome     PAST SURGICAL  HISTORY: Past Surgical History:  Procedure Laterality Date   ANAL RECTAL MANOMETRY N/A 05/19/2020   Procedure: ANO RECTAL MANOMETRY;  Surgeon: Sherrilyn Rist, MD;  Location: [facility];  Service: Gastroenterology;  Laterality: N/A;   BONE RESECTION, RIB     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    COLONOSCOPY     SHOULDER SURGERY Right    Thoracic outlet   UPPER GASTROINTESTINAL ENDOSCOPY      FAMILY HISTORY: Family History  Problem Relation Age of Onset   Hypertension Paternal Grandfather    Prostate cancer Paternal Grandfather        mets to bone   Hypertension Paternal Grandmother    Liver cancer Paternal Grandmother    Atrial fibrillation Paternal Grandmother    Irritable bowel syndrome Maternal Grandfather    Diverticulitis Maternal Grandfather    Hypertension Father    Hyperlipidemia Father    Ovarian cysts Mother    Ovarian cancer Mother    Skin cancer Mother    Irritable bowel syndrome Mother    Diverticulitis Mother    Ovarian cancer Other    Throat cancer Maternal Uncle    Colon cancer Neg Hx    Esophageal cancer Neg Hx    Rectal cancer Neg Hx    Stomach cancer Neg Hx     HEALTH MAINTENANCE: Social History   Tobacco Use   Smoking status: Every Day    Packs/day: 0.25  Years: 5.00    Total pack years: 1.25    Types: Cigarettes   Smokeless tobacco: Never  Vaping Use   Vaping Use: Never used  Substance Use Topics   Alcohol use: Yes    Comment: Occ    Drug use: Never     Allergies  Allergen Reactions   Bee Venom Swelling   Codeine Nausea And Vomiting, Rash and Other (See Comments)    Patient had a reaction to codeine cough syrup; caused hallucinations and vomiting    Current Outpatient Medications  Medication Sig Dispense Refill   ADDERALL XR 10 MG 24 hr capsule Take 15 mg by mouth daily.     amoxicillin (AMOXIL) 875 MG tablet Take 1 tablet (875 mg total) by mouth 2 (two) times daily. 20 tablet 0   fludrocortisone (FLORINEF) 0.1 MG tablet  Take 1 tablet (100 mcg total) by mouth daily. 90 tablet 3   hydrOXYzine (ATARAX/VISTARIL) 25 MG tablet Take 2 tablets by mouth 3 (three) times daily as needed.     Levonorgestrel (KYLEENA) 19.5 MG IUD 1 each by Intrauterine route once.      lidocaine (XYLOCAINE) 2 % solution Use as directed 15 mLs in the mouth or throat every 3 (three) hours as needed for mouth pain (mix with warm water gargle and spit). 50 mL 0   metoCLOPramide (REGLAN) 5 MG tablet Take 1 tablet (5 mg total) by mouth every 12 (twelve) hours as needed for up to 2 doses for nausea. Take 30-45 minutes before evening and AM doses of bowel preparation solution. 2 tablet 0   omeprazole (PRILOSEC) 20 MG capsule Take 20 mg by mouth every morning.     Plecanatide 3 MG TABS Take by mouth.     predniSONE (DELTASONE) 20 MG tablet Take 3 tabs PO daily x 5 days. 15 tablet 0   VRAYLAR capsule Take 3 mg by mouth daily.     No current facility-administered medications for this visit.    OBJECTIVE: There were no vitals filed for this visit.   There is no height or weight on file to calculate BMI.      General: Well-developed, well-nourished, no acute distress. Eyes: Pink conjunctiva, anicteric sclera. HEENT: Normocephalic, moist mucous membranes, clear oropharnyx. Lungs: Clear to auscultation bilaterally. Heart: Regular rate and rhythm. No rubs, murmurs, or gallops. Abdomen: Soft, nontender, nondistended. No organomegaly noted, normoactive bowel sounds. Musculoskeletal: No edema, cyanosis, or clubbing. Neuro: Alert, answering all questions appropriately. Cranial nerves grossly intact. Skin: No rashes or petechiae noted. Psych: Normal affect. Lymphatics: No cervical, calvicular, axillary or inguinal LAD.   LAB RESULTS:  Lab Results  Component Value Date   NA 136 11/22/2020   K 3.7 11/22/2020   CL 104 11/22/2020   CO2 24 11/22/2020   GLUCOSE 82 11/22/2020   BUN 5 (L) 11/22/2020   CREATININE 0.55 11/22/2020   CALCIUM 8.9  11/22/2020   PROT 6.7 11/22/2020   ALBUMIN 3.8 11/22/2020   AST 19 11/22/2020   ALT 24 11/22/2020   ALKPHOS 59 11/22/2020   BILITOT 0.4 11/22/2020   GFRNONAA >60 11/22/2020   GFRAA >60 09/25/2019    Lab Results  Component Value Date   WBC 10.2 11/22/2020   NEUTROABS 6.6 11/22/2020   HGB 13.1 11/22/2020   HCT 40.5 11/22/2020   MCV 83.0 11/22/2020   PLT 232 11/22/2020    No results found for: "TIBC", "FERRITIN", "IRONPCTSAT"   STUDIES: No results found.  ASSESSMENT AND PLAN:   Rosalyn Gess  is a 26 y.o. female with pmh of anemia, anxiety, depression, bipolar disorder, GERD, IBS, POTS, thoracic outlet syndrome was referred to hematology for further management of anemia.  # Iron deficiency anemia  - progressive in nature.  Likely from heavy menstrual period. -Not responsive to oral iron.  She has been taking for past 3 years. - Labs reviewed from 01/20/2022.  CBC showed hemoglobin 10.9, WBC 8.3, platelet 403, MCV 73.5.  Vitamin B12 306.  Folate 4.8.  Iron 34, saturation 7%.  Ferritin not available.  TSH normal.  - Colonoscopy done in September 2021 for left-sided abdominal pain and chronic constipation was normal.  EGD done in September 2021 for reflux symptoms and globus sensation was also normal. - Repeat labs today  -Discussed about IV Venofer 200 mg weekly x5.  Patient has had iv iron in the past and tolerated well  Orders Placed This Encounter  Procedures   Iron and TIBC([facility])   Ferritin   Vitamin B12   Folate   CBC with Differential   RTC in 10 weeks for MD visit, labs  Patient expressed understanding and was in agreement with this plan. She also understands that She can call clinic at any time with any questions, concerns, or complaints.   I spent a total of 45 minutes reviewing chart data, face-to-face evaluation with the patient, counseling and coordination of care as detailed above.  Michaelyn Barter, MD   06/11/2022 8:55 AM

## 2022-06-17 MED FILL — Iron Sucrose Inj 20 MG/ML (Fe Equiv): INTRAVENOUS | Qty: 10 | Status: AC

## 2022-06-18 ENCOUNTER — Inpatient Hospital Stay: Payer: 59 | Attending: Internal Medicine

## 2022-06-18 VITALS — BP 116/90 | HR 73 | Temp 97.9°F | Resp 18

## 2022-06-18 DIAGNOSIS — Z79899 Other long term (current) drug therapy: Secondary | ICD-10-CM | POA: Insufficient documentation

## 2022-06-18 DIAGNOSIS — N920 Excessive and frequent menstruation with regular cycle: Secondary | ICD-10-CM | POA: Insufficient documentation

## 2022-06-18 DIAGNOSIS — D500 Iron deficiency anemia secondary to blood loss (chronic): Secondary | ICD-10-CM | POA: Insufficient documentation

## 2022-06-18 DIAGNOSIS — D649 Anemia, unspecified: Secondary | ICD-10-CM

## 2022-06-18 MED ORDER — SODIUM CHLORIDE 0.9 % IV SOLN
Freq: Once | INTRAVENOUS | Status: AC
  Filled 2022-06-18: qty 250

## 2022-06-18 MED ORDER — SODIUM CHLORIDE 0.9 % IV SOLN
200.0000 mg | Freq: Once | INTRAVENOUS | Status: AC
  Administered 2022-06-18: 200 mg via INTRAVENOUS
  Filled 2022-06-18: qty 200

## 2022-06-18 NOTE — Patient Instructions (Signed)

## 2022-06-23 MED FILL — Iron Sucrose Inj 20 MG/ML (Fe Equiv): INTRAVENOUS | Qty: 10 | Status: AC

## 2022-06-24 ENCOUNTER — Inpatient Hospital Stay: Payer: 59

## 2022-06-24 VITALS — BP 110/63 | HR 84 | Temp 96.8°F | Resp 16

## 2022-06-24 DIAGNOSIS — D500 Iron deficiency anemia secondary to blood loss (chronic): Secondary | ICD-10-CM | POA: Diagnosis not present

## 2022-06-24 DIAGNOSIS — D649 Anemia, unspecified: Secondary | ICD-10-CM

## 2022-06-24 MED ORDER — SODIUM CHLORIDE 0.9 % IV SOLN
Freq: Once | INTRAVENOUS | Status: AC
  Filled 2022-06-24: qty 250

## 2022-06-24 MED ORDER — SODIUM CHLORIDE 0.9 % IV SOLN
200.0000 mg | Freq: Once | INTRAVENOUS | Status: AC
  Administered 2022-06-24: 200 mg via INTRAVENOUS
  Filled 2022-06-24: qty 200

## 2022-06-25 ENCOUNTER — Inpatient Hospital Stay: Payer: 59

## 2022-07-01 MED FILL — Iron Sucrose Inj 20 MG/ML (Fe Equiv): INTRAVENOUS | Qty: 10 | Status: AC

## 2022-07-02 ENCOUNTER — Inpatient Hospital Stay: Payer: 59

## 2022-07-02 VITALS — BP 115/67 | HR 70 | Resp 16

## 2022-07-02 DIAGNOSIS — D649 Anemia, unspecified: Secondary | ICD-10-CM

## 2022-07-02 DIAGNOSIS — D500 Iron deficiency anemia secondary to blood loss (chronic): Secondary | ICD-10-CM | POA: Diagnosis not present

## 2022-07-02 MED ORDER — SODIUM CHLORIDE 0.9 % IV SOLN
Freq: Once | INTRAVENOUS | Status: AC
  Filled 2022-07-02: qty 250

## 2022-07-02 MED ORDER — SODIUM CHLORIDE 0.9 % IV SOLN
200.0000 mg | Freq: Once | INTRAVENOUS | Status: AC
  Administered 2022-07-02: 200 mg via INTRAVENOUS
  Filled 2022-07-02: qty 200

## 2022-07-02 NOTE — Patient Instructions (Signed)

## 2022-07-12 MED FILL — Iron Sucrose Inj 20 MG/ML (Fe Equiv): INTRAVENOUS | Qty: 10 | Status: AC

## 2022-07-13 ENCOUNTER — Inpatient Hospital Stay: Payer: 59

## 2022-07-23 ENCOUNTER — Inpatient Hospital Stay: Payer: 59 | Attending: Internal Medicine

## 2022-07-23 ENCOUNTER — Encounter: Payer: Self-pay | Admitting: Internal Medicine

## 2022-07-23 VITALS — BP 117/69 | HR 72 | Resp 16

## 2022-07-23 DIAGNOSIS — D649 Anemia, unspecified: Secondary | ICD-10-CM

## 2022-07-23 DIAGNOSIS — Z79899 Other long term (current) drug therapy: Secondary | ICD-10-CM | POA: Diagnosis not present

## 2022-07-23 DIAGNOSIS — D500 Iron deficiency anemia secondary to blood loss (chronic): Secondary | ICD-10-CM | POA: Diagnosis present

## 2022-07-23 DIAGNOSIS — Z8759 Personal history of other complications of pregnancy, childbirth and the puerperium: Secondary | ICD-10-CM | POA: Insufficient documentation

## 2022-07-23 MED ORDER — SODIUM CHLORIDE 0.9 % IV SOLN
200.0000 mg | Freq: Once | INTRAVENOUS | Status: AC
  Administered 2022-07-23: 200 mg via INTRAVENOUS
  Filled 2022-07-23: qty 200

## 2022-07-23 MED ORDER — SODIUM CHLORIDE 0.9 % IV SOLN
Freq: Once | INTRAVENOUS | Status: AC
  Filled 2022-07-23: qty 250

## 2022-07-23 NOTE — Progress Notes (Signed)
Pt refused to stay for 30 minutes post monitoring

## 2022-07-29 MED FILL — Iron Sucrose Inj 20 MG/ML (Fe Equiv): INTRAVENOUS | Qty: 10 | Status: AC

## 2022-07-30 ENCOUNTER — Inpatient Hospital Stay: Payer: 59

## 2022-08-04 ENCOUNTER — Encounter: Payer: Self-pay | Admitting: Internal Medicine

## 2022-08-13 ENCOUNTER — Inpatient Hospital Stay: Payer: 59

## 2022-08-20 ENCOUNTER — Inpatient Hospital Stay: Payer: 59

## 2022-08-20 ENCOUNTER — Inpatient Hospital Stay: Payer: 59 | Attending: Internal Medicine | Admitting: Internal Medicine
